# Patient Record
Sex: Female | Born: 1955
Health system: Southern US, Community
[De-identification: ages and names within clinical notes are randomized; demographics above are authoritative.]

## PROBLEM LIST (undated history)

## (undated) DIAGNOSIS — F32A Depression, unspecified: Secondary | ICD-10-CM

## (undated) DIAGNOSIS — K219 Gastro-esophageal reflux disease without esophagitis: Secondary | ICD-10-CM

## (undated) DIAGNOSIS — G5600 Carpal tunnel syndrome, unspecified upper limb: Secondary | ICD-10-CM

## (undated) DIAGNOSIS — I341 Nonrheumatic mitral (valve) prolapse: Secondary | ICD-10-CM

## (undated) DIAGNOSIS — E785 Hyperlipidemia, unspecified: Secondary | ICD-10-CM

## (undated) DIAGNOSIS — I10 Essential (primary) hypertension: Secondary | ICD-10-CM

## (undated) DIAGNOSIS — T7840XA Allergy, unspecified, initial encounter: Secondary | ICD-10-CM

## (undated) DIAGNOSIS — F329 Major depressive disorder, single episode, unspecified: Secondary | ICD-10-CM

## (undated) DIAGNOSIS — M1990 Unspecified osteoarthritis, unspecified site: Secondary | ICD-10-CM

## (undated) HISTORY — DX: Hyperlipidemia, unspecified: E78.5

## (undated) HISTORY — DX: Allergy, unspecified, initial encounter: T78.40XA

## (undated) HISTORY — PX: CHOLECYSTECTOMY: SHX55

## (undated) HISTORY — DX: Gastro-esophageal reflux disease without esophagitis: K21.9

## (undated) HISTORY — DX: Carpal tunnel syndrome, unspecified upper limb: G56.00

## (undated) HISTORY — DX: Essential (primary) hypertension: I10

## (undated) HISTORY — DX: Unspecified osteoarthritis, unspecified site: M19.90

## (undated) HISTORY — DX: Nonrheumatic mitral (valve) prolapse: I34.1

## (undated) HISTORY — PX: OTHER SURGICAL HISTORY: SHX169

## (undated) HISTORY — DX: Major depressive disorder, single episode, unspecified: F32.9

## (undated) HISTORY — DX: Depression, unspecified: F32.A

---

## 1985-10-08 HISTORY — PX: GALLBLADDER SURGERY: SHX652

## 2008-08-15 ENCOUNTER — Ambulatory Visit: Payer: Self-pay | Admitting: Cardiology

## 2008-08-15 ENCOUNTER — Observation Stay: Payer: Self-pay | Admitting: Internal Medicine

## 2008-12-27 ENCOUNTER — Emergency Department: Payer: Self-pay | Admitting: Emergency Medicine

## 2013-06-03 ENCOUNTER — Ambulatory Visit: Payer: Self-pay | Admitting: Family Medicine

## 2013-12-06 LAB — HM PAP SMEAR: HM PAP: NORMAL

## 2013-12-06 LAB — HM MAMMOGRAPHY

## 2014-04-08 ENCOUNTER — Ambulatory Visit: Payer: Self-pay | Admitting: Gastroenterology

## 2014-04-08 LAB — HM COLONOSCOPY

## 2015-04-26 ENCOUNTER — Telehealth: Payer: Self-pay | Admitting: *Deleted

## 2015-04-26 NOTE — Telephone Encounter (Signed)
Received referral for low dose CT lung cancer screening. Despite multiple attempts at all contact numbers available, have not been able to arrange for shared decision making visit and CT scan. I will be happy to assist in the future if patient so desires. Will forward to referring provider.

## 2017-03-11 ENCOUNTER — Ambulatory Visit: Payer: Self-pay | Admitting: Primary Care

## 2017-04-02 ENCOUNTER — Ambulatory Visit (INDEPENDENT_AMBULATORY_CARE_PROVIDER_SITE_OTHER): Payer: BLUE CROSS/BLUE SHIELD | Admitting: Primary Care

## 2017-04-02 ENCOUNTER — Encounter: Payer: Self-pay | Admitting: Primary Care

## 2017-04-02 VITALS — BP 126/80 | HR 60 | Temp 97.9°F | Ht 60.25 in | Wt 136.4 lb

## 2017-04-02 DIAGNOSIS — F411 Generalized anxiety disorder: Secondary | ICD-10-CM | POA: Insufficient documentation

## 2017-04-02 DIAGNOSIS — E785 Hyperlipidemia, unspecified: Secondary | ICD-10-CM | POA: Diagnosis not present

## 2017-04-02 DIAGNOSIS — G4700 Insomnia, unspecified: Secondary | ICD-10-CM | POA: Diagnosis not present

## 2017-04-02 DIAGNOSIS — R209 Unspecified disturbances of skin sensation: Secondary | ICD-10-CM | POA: Diagnosis not present

## 2017-04-02 DIAGNOSIS — I10 Essential (primary) hypertension: Secondary | ICD-10-CM

## 2017-04-02 MED ORDER — TRAZODONE HCL 50 MG PO TABS: 50.0000 mg | ORAL_TABLET | Freq: Every evening | ORAL | 0 refills | Status: DC | PRN

## 2017-04-02 MED ORDER — HYDROXYZINE HCL 25 MG PO TABS: 25.0000 mg | ORAL_TABLET | Freq: Two times a day (BID) | ORAL | 0 refills | Status: DC | PRN

## 2017-04-02 MED ORDER — SERTRALINE HCL 50 MG PO TABS: 50.0000 mg | ORAL_TABLET | Freq: Every day | ORAL | 1 refills | Status: DC

## 2017-04-02 NOTE — Assessment & Plan Note (Signed)
Stable in the office today, continue lisinopril 20 mg. Will get BMP from records.

## 2017-04-02 NOTE — Assessment & Plan Note (Signed)
Present for years, mind races. Suspect this is anxiety induced. She is doing better on Zoloft. Strongly advised against Lorazepam due to its addictive nature. Rx for low dose Trazodone sent to pharmacy. She will update is in a few weeks.

## 2017-04-02 NOTE — Progress Notes (Addendum)
Subjective:    Patient ID: Morgan Riley, female    DOB: [birth year], 61 y.o.   MRN: [id number]  HPI  Morgan Riley is a 61 year old female who presents today to establish care and discuss the problems mentioned below. Will obtain old records. Her last physical was several years ago.  1) GAD: Currently managed on sertraline 50 mg (initiated 3 weeks ago), lorazepam 0.5 mg every morning and 0.5 mg every evening. She was previously managed on another medication for which she saw no improvement, so she was switched to Zoloft. She was successful on Zoloft in the past and has done well on it over the past 3 weeks. She is going through a lot of personal stress recently, but feels that she is managing okay. She uses the lorazepam mainly for insomnia at night has her mind tends to race. She's been on the Lorazepam BID for years. She's never tried anything else for insomnia.  2) Essential Hypertension: Currently managed on lisinopril 20 mg. Her BP in the office today is 126/80. She denies chest pain, dizziness, visual changes.   3) Hyperlipidemia: Currently managed on Simvastatin 40 mg. Her last cholesterol check was about 6 months ago, but she's not entirely sure. She thinks it was normal.  4) Skin Discomfort: She uses gabapentin 300 mg twice to three times daily due to sensitive skin. Her clothing and touch will aggravate her skin causing discomfort. The gabapentin has helped to reduce arthritic pain.  5) GERD: Currently managed on omeprazole 20 mg every other day for symptoms of epigastric burning and pain. She cannot tolerate symptoms without her medication.   Review of Systems  Constitutional: Negative for fatigue.  Respiratory: Negative for shortness of breath.   Cardiovascular: Negative for chest pain.  Gastrointestinal:       Gerd   Neurological: Negative for dizziness and headaches.  Psychiatric/Behavioral: Positive for sleep disturbance. Negative for suicidal ideas.       Overall much better on  Zoloft.       Past Medical History:  Diagnosis Date  . Allergy   . Arthritis   . Depression   . GERD (gastroesophageal reflux disease)   . Hyperlipidemia   . Hypertension      Social History   Social History  . Marital status: Married    Spouse name: N/A  . Number of children: N/A  . Years of education: N/A   Occupational History  . Not on file.   Social History Main Topics  . Smoking status: Current Every Day Smoker    Packs/day: 1.00  . Smokeless tobacco: Never Used  . Alcohol use No  . Drug use: Unknown  . Sexual activity: Not on file   Other Topics Concern  . Not on file   Social History Narrative  . No narrative on file    Past Surgical History:  Procedure Laterality Date  . [place]  . tumor remove in right leg      Family History  Problem Relation Age of Onset  . Arthritis Mother   . Stroke Mother   . Hypertension Mother   . Mental illness Mother   . Diabetes Mother   . Arthritis Father   . Heart disease Father   . Hypertension Father   . Mental illness Sister   . Mental illness Sister     No Known Allergies  No current outpatient prescriptions on file prior to visit.   No current facility-administered medications on file  prior to visit.     BP 126/80   Pulse 60   Temp 97.9 F (36.6 C) (Oral)   Ht 5' 0.25" (1.53 m)   Wt 136 lb 6.4 oz (61.9 kg)   SpO2 99%   BMI 26.42 kg/m    Objective:   Physical Exam  Constitutional: She appears well-nourished.  Neck: Neck supple.  Cardiovascular: Normal rate and regular rhythm.   Pulmonary/Chest: Effort normal and breath sounds normal.  Skin: Skin is warm and dry.  Psychiatric: She has a normal mood and affect.          Assessment & Plan:

## 2017-04-02 NOTE — Assessment & Plan Note (Addendum)
Doing better on Zoloft, continue same. Refills sent to pharmacy. Strongly advised against Lorazepam due to its addictive nature. Will have her try hydroxyzine 25 mg PRN anxiety. She's mostly using Lorazepam for sleep, will trial Trazodone in place of. Removed Lorazepam from medication list as we will try to avoid prescribing if other measures work.

## 2017-04-02 NOTE — Assessment & Plan Note (Signed)
Managed on simvastatin 40 mg. Will check records for recent lipid panel. Continue current regimen.

## 2017-04-02 NOTE — Patient Instructions (Signed)
I sent refills for sertraline 50 mg tablets to your pharmacy for anxiety. Continue taking this once daily.  Try using hydroxyzine 25 mg tablets twice daily as needed for anxiety. Try not to use the Lorazepam as this is highly addictive to the body and is causing long term side effects.  Try trazodone 50 mg tablets for sleep. Take 1 tablet by mouth 30 minutes prior to bedtime as needed for sleep.  Please update [place] in a few weeks.  Please schedule a physical with me sometime this year at your convenience. You may also schedule a lab only appointment 3-4 days prior. We will discuss your lab results in detail during your physical.  It was a pleasure to meet you today! Please don't hesitate to call me with any questions. Welcome to [facility]!

## 2017-04-02 NOTE — Assessment & Plan Note (Signed)
Managed on gabapentin BID to TID with improvement. Continue same. Will obtain records for review.

## 2017-04-15 ENCOUNTER — Encounter: Payer: Self-pay | Admitting: Primary Care

## 2017-06-13 ENCOUNTER — Encounter: Payer: Self-pay | Admitting: Primary Care

## 2017-06-13 ENCOUNTER — Telehealth: Payer: Self-pay | Admitting: *Deleted

## 2017-06-13 ENCOUNTER — Ambulatory Visit (INDEPENDENT_AMBULATORY_CARE_PROVIDER_SITE_OTHER): Payer: BLUE CROSS/BLUE SHIELD | Admitting: Primary Care

## 2017-06-13 VITALS — BP 122/78 | HR 67 | Temp 98.0°F | Ht 60.25 in | Wt 133.0 lb

## 2017-06-13 DIAGNOSIS — G4700 Insomnia, unspecified: Secondary | ICD-10-CM

## 2017-06-13 DIAGNOSIS — F411 Generalized anxiety disorder: Secondary | ICD-10-CM

## 2017-06-13 DIAGNOSIS — Z122 Encounter for screening for malignant neoplasm of respiratory organs: Secondary | ICD-10-CM | POA: Diagnosis not present

## 2017-06-13 DIAGNOSIS — Z1231 Encounter for screening mammogram for malignant neoplasm of breast: Secondary | ICD-10-CM

## 2017-06-13 DIAGNOSIS — Z87891 Personal history of nicotine dependence: Secondary | ICD-10-CM

## 2017-06-13 DIAGNOSIS — Z1239 Encounter for other screening for malignant neoplasm of breast: Secondary | ICD-10-CM

## 2017-06-13 MED ORDER — SERTRALINE HCL 100 MG PO TABS
100.0000 mg | ORAL_TABLET | Freq: Every day | ORAL | 0 refills | Status: DC
Start: 2017-06-13 — End: 2017-09-09

## 2017-06-13 MED ORDER — HYDROXYZINE HCL 10 MG PO TABS: ORAL_TABLET | ORAL | 0 refills | Status: DC

## 2017-06-13 NOTE — Assessment & Plan Note (Addendum)
Improved on hydroxyzine, will discontinue trazodone. Refill sent to pharmacy for lower dose as she will also take this during the day for acute anxiety.

## 2017-06-13 NOTE — Patient Instructions (Signed)
We've increased your Zoloft from 50 mg to 100 mg. Start taking 1 and 1/2 tablets for one week then increase to the 100 mg tablet of Zoloft once daily.  I sent a new prescription for hydroxyzine to the pharmacy. Use this as needed for anxiety.  Call the [facility] to schedule your mammogram.  You will be contacted regarding your referral for Lung Cancer Screening.  Please let us know if you have not heard back within one week.   Please schedule a physical with me in the near future. You may also schedule a lab only appointment 3-4 days prior. We will discuss your lab results in detail during your physical.  It was a pleasure to see you today!

## 2017-06-13 NOTE — Telephone Encounter (Signed)
Received referral for initial lung cancer screening scan. Contacted patient and obtained smoking history,(current, 35 pack year) as well as answering questions related to screening process. Patient denies signs of lung cancer such as weight loss or hemoptysis. Patient denies comorbidity that would prevent curative treatment if lung cancer were found. Patient is scheduled for shared decision making visit and CT scan on 06/19/17.

## 2017-06-13 NOTE — Assessment & Plan Note (Signed)
Will increase Zoloft to 75 mg 1 week, then to 100 mg thereafter. Reduced dose of hydroxyzine to prevent drowsiness. We'll call her for an update in 4 weeks for follow-up of the increased dose of Zoloft.

## 2017-06-13 NOTE — Progress Notes (Signed)
Subjective:    Patient ID: Morgan Riley, female    DOB: [birth year], 61 y.o.   MRN: [id number]  HPI  Morgan Riley is a 61 year old female who presents today to discuss anxiety. She is also due for a mammogram and is wanting to get that done. She also thinks she is due for repeat colonoscopy.  She is currently managed on Zoloft 50 mg and Trazodone 50 mg. Previously managed on lorazepam which was discouraged/discontinued in late June 2018. She had been taking lorazepam BID for years. Last visit she was placed on hydroxyzine which helps with sleep but causes drowsiness if she takes this during the day. She thinks she needs something in the morning when waking to help with morning anxiety. When waking she feels very anxious.  She's overall doing better on Zoloft as she's not tearful. She continues to worry and feel nervous. She has a hard time dealing with stress. She does watch her 60 year old granddaughter daily. She's not had to use the trazodone as the hydroxyzine has helped with sleep at bedtime. She denies SI/HI.  2) Tobacco Abuse: History of tobacco abuse for the past 30 years. Currently smoking 1/2-3/4 packs per day. She is working to wean herself down. She is interested in lung cancer screening and denies hemoptysis, unexplained weight loss, chronic cough. She does experience intermittent shortness of breath with exertion.  Review of Systems  Constitutional: Negative for fatigue and unexpected weight change.  Respiratory:       Intermittent shortness of breath.  Cardiovascular: Negative for chest pain.  Psychiatric/Behavioral: The patient is nervous/anxious.        Past Medical History:  Diagnosis Date  . Allergy   . Arthritis   . Carpal tunnel syndrome   . Depression   . GERD (gastroesophageal reflux disease)   . Hyperlipidemia   . Hypertension   . Mitral valve prolapse      Social History   Social History  . Marital status: Married    Spouse name: N/A  . Number of children:  N/A  . Years of education: N/A   Occupational History  . Not on file.   Social History Main Topics  . Smoking status: Current Every Day Smoker    Packs/day: 1.00  . Smokeless tobacco: Never Used  . Alcohol use No  . Drug use: Unknown  . Sexual activity: Not on file   Other Topics Concern  . Not on file   Social History Narrative  . No narrative on file    Past Surgical History:  Procedure Laterality Date  . [place]  . tumor remove in right leg      Family History  Problem Relation Age of Onset  . Arthritis Mother   . Stroke Mother   . Hypertension Mother   . Mental illness Mother   . Diabetes Mother   . Arthritis Father   . Heart disease Father   . Hypertension Father   . Mental illness Sister   . Mental illness Sister     No Known Allergies  Current Outpatient Prescriptions on File Prior to Visit  Medication Sig Dispense Refill  . amoxicillin (AMOXIL) 875 MG tablet TAKE 1 TABLET BY MOUTH THREE TIMES A DAY TODAY THEN TWICE A DAY UNTIL GONE  0  . gabapentin (NEURONTIN) 300 MG capsule Take by mouth.    Marland Kitchen lisinopril (PRINIVIL,ZESTRIL) 20 MG tablet Take 20 mg by mouth daily.  3  . omeprazole (PRILOSEC)  20 MG capsule Take 20 mg by mouth daily.  5  . simvastatin (ZOCOR) 40 MG tablet TAKE 1 TABLET (40 MG TOTAL) BY MOUTH NIGHTLY.    . traZODone (DESYREL) 50 MG tablet Take 1 tablet (50 mg total) by mouth at bedtime as needed for sleep. 30 tablet 0   No current facility-administered medications on file prior to visit.     BP 122/78   Pulse 67   Temp 98 F (36.7 C) (Oral)   Ht 5' 0.25" (1.53 m)   Wt 133 lb (60.3 kg)   SpO2 98%   BMI 25.76 kg/m    Objective:   Physical Exam  Constitutional: She appears well-nourished.  Neck: Neck supple.  Cardiovascular: Normal rate and regular rhythm.   Pulmonary/Chest: Effort normal and breath sounds normal. She has no rales.  Skin: Skin is warm and dry.  Psychiatric: She has a normal mood and affect.            Assessment & Plan:

## 2017-06-14 ENCOUNTER — Telehealth: Payer: Self-pay | Admitting: Primary Care

## 2017-06-14 ENCOUNTER — Other Ambulatory Visit: Payer: Self-pay | Admitting: Primary Care

## 2017-06-14 DIAGNOSIS — Z1211 Encounter for screening for malignant neoplasm of colon: Secondary | ICD-10-CM

## 2017-06-14 NOTE — Telephone Encounter (Signed)
Please notify patient that I received her colonoscopy report and that she's due now for repeat colonoscopy. i've placed a referral to GI so someone should be in touch with her soon.

## 2017-06-18 NOTE — Telephone Encounter (Signed)
Spoken and notified patient of Kate's comments. Patient verbalized understanding. 

## 2017-06-19 ENCOUNTER — Ambulatory Visit
Admission: RE | Admit: 2017-06-19 | Discharge: 2017-06-19 | Disposition: A | Discharge status: Home / Self Care | Payer: BLUE CROSS/BLUE SHIELD | Source: Ambulatory Visit | Attending: Oncology | Admitting: Oncology

## 2017-06-19 ENCOUNTER — Inpatient Hospital Stay: Payer: BLUE CROSS/BLUE SHIELD | Attending: Nurse Practitioner | Admitting: Nurse Practitioner

## 2017-06-19 ENCOUNTER — Encounter: Payer: Self-pay | Admitting: Nurse Practitioner

## 2017-06-19 DIAGNOSIS — J439 Emphysema, unspecified: Secondary | ICD-10-CM | POA: Diagnosis not present

## 2017-06-19 DIAGNOSIS — R918 Other nonspecific abnormal finding of lung field: Secondary | ICD-10-CM | POA: Diagnosis not present

## 2017-06-19 DIAGNOSIS — I2510 Atherosclerotic heart disease of native coronary artery without angina pectoris: Secondary | ICD-10-CM | POA: Diagnosis not present

## 2017-06-19 DIAGNOSIS — Z87891 Personal history of nicotine dependence: Secondary | ICD-10-CM | POA: Insufficient documentation

## 2017-06-19 DIAGNOSIS — I700 Atherosclerosis of aorta: Secondary | ICD-10-CM | POA: Diagnosis not present

## 2017-06-19 DIAGNOSIS — F17210 Nicotine dependence, cigarettes, uncomplicated: Secondary | ICD-10-CM | POA: Diagnosis not present

## 2017-06-19 DIAGNOSIS — Z122 Encounter for screening for malignant neoplasm of respiratory organs: Secondary | ICD-10-CM

## 2017-06-20 DIAGNOSIS — Z720 Tobacco use: Secondary | ICD-10-CM | POA: Insufficient documentation

## 2017-06-20 NOTE — Progress Notes (Signed)
In accordance with CMS guidelines, patient has met eligibility criteria including age, absence of signs or symptoms of lung cancer.  Social History  Substance Use Topics  . Smoking status: Current Every Day Smoker    Packs/day: 1.00    Years: 35.00  . Smokeless tobacco: Never Used  . Alcohol use No     A shared decision-making session was conducted prior to the performance of CT scan. This includes one or more decision aids, includes benefits and harms of screening, follow-up diagnostic testing, over-diagnosis, false positive rate, and total radiation exposure.  Counseling on the importance of adherence to annual lung cancer LDCT screening, impact of co-morbidities, and ability or willingness to undergo diagnosis and treatment is imperative for compliance of the program.  Counseling on the importance of continued smoking cessation for former smokers; the importance of smoking cessation for current smokers, and information about tobacco cessation interventions have been given to patient including [place] and [facility] quit [place] programs.  Written order for lung cancer screening with LDCT has been given to the patient and any and all questions have been answered to the best of my abilities.   Yearly follow up will be coordinated by Burgess Estelle, Thoracic Navigator.  Beckey Rutter, NP 06/20/17 8:51 AM

## 2017-06-21 ENCOUNTER — Encounter: Payer: Self-pay | Admitting: *Deleted

## 2017-07-01 ENCOUNTER — Telehealth: Payer: Self-pay

## 2017-07-01 NOTE — Telephone Encounter (Signed)
Please have patient seen in the office this week for evaluation, I need to evaluate her. Thanks.

## 2017-07-01 NOTE — Telephone Encounter (Signed)
Spoken and notified patient of Kate's comments. Patient verbalized understanding.  Office visit on 07/02/2017

## 2017-07-01 NOTE — Telephone Encounter (Signed)
Pt left v/m; pt had a CT chest for lung CA screening;pt got notice that the scan showed inflammation in lungs; pt has had fever and pt request Allie Bossier NP to review findings of CT chest and cb if needs any type treatment. [facility].

## 2017-07-02 ENCOUNTER — Ambulatory Visit (INDEPENDENT_AMBULATORY_CARE_PROVIDER_SITE_OTHER): Payer: BLUE CROSS/BLUE SHIELD | Admitting: Primary Care

## 2017-07-02 VITALS — BP 124/76 | HR 64 | Temp 97.8°F | Ht 60.25 in | Wt 133.1 lb

## 2017-07-02 DIAGNOSIS — R350 Frequency of micturition: Secondary | ICD-10-CM

## 2017-07-02 DIAGNOSIS — J189 Pneumonia, unspecified organism: Secondary | ICD-10-CM | POA: Diagnosis not present

## 2017-07-02 LAB — POC URINALSYSI DIPSTICK (AUTOMATED)
Bilirubin, UA: NEGATIVE
Blood, UA: NEGATIVE
Glucose, UA: NEGATIVE
Ketones, UA: NEGATIVE
Leukocytes, UA: NEGATIVE
Nitrite, UA: NEGATIVE
PH UA: 8 (ref 5.0–8.0)
PROTEIN UA: NEGATIVE
Spec Grav, UA: 1.015 (ref 1.010–1.025)
Urobilinogen, UA: 0.2 E.U./dL

## 2017-07-02 MED ORDER — AMOXICILLIN-POT CLAVULANATE 875-125 MG PO TABS: 1.0000 | ORAL_TABLET | Freq: Two times a day (BID) | ORAL | 0 refills | Status: DC

## 2017-07-02 NOTE — Progress Notes (Signed)
Subjective:    Patient ID: Morgan Riley, female    DOB: November 01, 1955, 61 y.o.   MRN: [id number]  HPI  Morgan Riley is a 61 year old female who presents today for follow up from recent CT results. She underwent low dose CT scan for lung cancer screening on 06/19/17. Her CT was negative for malignancy but did show suspicion for nonspecific interstitial pneumonitis.   Over the past two weeks she's experiencing low grade fevers, chills, morning cough, fatigue, urinary frequency. She's taken one dose of Amoxicillin 875 mg from a prescription that was provided by her dentist for a tooth infection. She denies hematuria, abdominal pain, nausea, vaginal symptoms.  Review of Systems  HENT: Positive for congestion. Negative for sore throat.   Respiratory: Positive for cough and shortness of breath. Negative for wheezing.   Cardiovascular: Negative for chest pain.  Genitourinary: Positive for frequency. Negative for dysuria, flank pain, hematuria and vaginal discharge.  Musculoskeletal: Positive for arthralgias.       Past Medical History:  Diagnosis Date  . Allergy   . Arthritis   . Carpal tunnel syndrome   . Depression   . GERD (gastroesophageal reflux disease)   . Hyperlipidemia   . Hypertension   . Mitral valve prolapse      Social History   Social History  . Marital status: Married    Spouse name: N/A  . Number of children: N/A  . Years of education: N/A   Occupational History  . Not on file.   Social History Main Topics  . Smoking status: Current Every Day Smoker    Packs/day: 1.00    Years: 35.00  . Smokeless tobacco: Never Used  . Alcohol use No  . Drug use: Unknown  . Sexual activity: Not on file   Other Topics Concern  . Not on file   Social History Narrative  . No narrative on file    Past Surgical History:  Procedure Laterality Date  . [place]  . tumor remove in right leg      Family History  Problem Relation Age of Onset  . Arthritis  Mother   . Stroke Mother   . Hypertension Mother   . Mental illness Mother   . Diabetes Mother   . Arthritis Father   . Heart disease Father   . Hypertension Father   . Mental illness Sister   . Mental illness Sister     No Known Allergies  Current Outpatient Prescriptions on File Prior to Visit  Medication Sig Dispense Refill  . amoxicillin (AMOXIL) 875 MG tablet TAKE 1 TABLET BY MOUTH THREE TIMES A DAY TODAY THEN TWICE A DAY UNTIL GONE  0  . gabapentin (NEURONTIN) 300 MG capsule Take by mouth.    . hydrOXYzine (ATARAX/VISTARIL) 10 MG tablet Take 1 tablet in the morning as needed for anxiety and take 1-2 tablets at bedtime as needed for anxiety. 90 tablet 0  . lisinopril (PRINIVIL,ZESTRIL) 20 MG tablet Take 20 mg by mouth daily.  3  . omeprazole (PRILOSEC) 20 MG capsule Take 20 mg by mouth daily.  5  . sertraline (ZOLOFT) 100 MG tablet Take 1 tablet (100 mg total) by mouth daily. 90 tablet 0  . simvastatin (ZOCOR) 40 MG tablet TAKE 1 TABLET (40 MG TOTAL) BY MOUTH NIGHTLY.     No current facility-administered medications on file prior to visit.     BP 124/76   Pulse 64   Temp 97.8 F (36.6  C) (Oral)   Ht 5' 0.25" (1.53 m)   Wt 133 lb 1.9 oz (60.4 kg)   SpO2 97%   BMI 25.78 kg/m    Objective:   Physical Exam  Constitutional: She appears well-nourished. She does not have a sickly appearance.  HENT:  Right Ear: Tympanic membrane and ear canal normal.  Left Ear: Tympanic membrane and ear canal normal.  Nose: Right sinus exhibits no maxillary sinus tenderness and no frontal sinus tenderness. Left sinus exhibits no maxillary sinus tenderness and no frontal sinus tenderness.  Mouth/Throat: Oropharynx is clear and moist.  Eyes: Conjunctivae are normal.  Neck: Neck supple.  Cardiovascular: Normal rate and regular rhythm.   Pulmonary/Chest: Effort normal and breath sounds normal. She has no rales.  Difficult to auscultate lung sounds to upper lobes, seem diminished.     Lymphadenopathy:    She has no cervical adenopathy.  Skin: Skin is warm and dry.          Assessment & Plan:  Pneumonitis:  Noted on CT scan for lung cancer screening. Has had symptoms for the past 2 weeks, no improvement. Exam today with diminished sounds to bilateral upper lobes. Rx for Augmentin course sent to pharmacy. Stop Amoxil from old prescription. Fluids, rest, follow up PRN.  Sheral Flow, NP

## 2017-07-02 NOTE — Patient Instructions (Signed)
Start Augmentin antibiotics. Take 1 tablet by mouth twice daily for 7 days.  You do not have a urinary tract infection.  Ensure you stay hydrated with water and rest.  It was a pleasure to see you today!

## 2017-07-03 ENCOUNTER — Telehealth: Payer: Self-pay

## 2017-07-03 DIAGNOSIS — T3695XA Adverse effect of unspecified systemic antibiotic, initial encounter: Principal | ICD-10-CM

## 2017-07-03 DIAGNOSIS — B379 Candidiasis, unspecified: Secondary | ICD-10-CM

## 2017-07-03 MED ORDER — FLUCONAZOLE 150 MG PO TABS: 150.0000 mg | ORAL_TABLET | Freq: Once | ORAL | 0 refills | Status: AC

## 2017-07-03 NOTE — Telephone Encounter (Signed)
Noted, please notify patient that I sent in [place] to her pharmacy. Take 1 tablet by mouth once.

## 2017-07-03 NOTE — Telephone Encounter (Signed)
Pt left v/m; pt seen 07/02/17 with pneumonia and was given abx and pt request med for yeast infection (gets yeast infection after taking abx) sent to [facility].

## 2017-07-03 NOTE — Telephone Encounter (Signed)
Tried to call patient and could not leave message since voicemail is not set up.

## 2017-07-04 NOTE — Progress Notes (Signed)
Cardiology Office Note  Date:  07/05/2017   ID:  Morgan Riley, DOB Mar 16, 1956, MRN [id number]  PCP:  Morgan Koch, NP   Chief Complaint  Patient presents with  . other    Aortic atherosclerosis. Meds reviewed verbally with pt.    HPI:  Morgan Riley is a 61 year old woman with history of Smoker, active Hyperlipidemia Hypertension CT scan showing emphysema, aortic atherosis, coronary disease Who presents by referral from Morgan Riley for consultation of her CT findings of aortic atherosclerosis and coronary disease  On evaluation today, she reports that she is getting over a pneumonia She reports having recent shortness of breath and chest tightness, Symptoms were new, she felt  that something was not right  CT scan chest was performed  06/19/2017 Cardiovascular: Tortuous thoracic aorta. Aortic atherosclerosis. Normal heart size with mild lipomatous hypertrophy of the interatrial septum. Multivessel coronary artery atherosclerosis. Emphysema  Images pulled up in the office today, minimal coronary calcification noted in the LAD, proximal RCA. Very mild aortic atherosclerosis seen No significant plaque noted in the proximal carotid  Denies having any symptoms of chest pain or shortness of breath prior to recent upper respiratory infection, typically is very active No regular exercise program  EKG personally reviewed by myself on todays visit Shows normal sinus rhythm with rate 73 bpm no significant ST or T-wave changes, rare PVC    PMH:   has a past medical history of Allergy; Arthritis; Carpal tunnel syndrome; Depression; GERD (gastroesophageal reflux disease); Hyperlipidemia; Hypertension; and Mitral valve prolapse.  PSH:    Past Surgical History:  Procedure Laterality Date  . [place]  . tumor remove in right leg      Current Outpatient Prescriptions  Medication Sig Dispense Refill  . acetaminophen (TYLENOL) 325 MG tablet Take 650 mg by mouth  every 6 (six) hours as needed.    Marland Kitchen amoxicillin (AMOXIL) 875 MG tablet TAKE 1 TABLET BY MOUTH THREE TIMES A DAY TODAY THEN TWICE A DAY UNTIL GONE  0  . gabapentin (NEURONTIN) 300 MG capsule Take by mouth.    . hydrOXYzine (ATARAX/VISTARIL) 10 MG tablet Take 1 tablet in the morning as needed for anxiety and take 1-2 tablets at bedtime as needed for anxiety. 90 tablet 0  . lisinopril (PRINIVIL,ZESTRIL) 20 MG tablet Take 20 mg by mouth daily.  3  . omeprazole (PRILOSEC) 20 MG capsule Take 20 mg by mouth daily.  5  . sertraline (ZOLOFT) 100 MG tablet Take 1 tablet (100 mg total) by mouth daily. 90 tablet 0  . simvastatin (ZOCOR) 40 MG tablet TAKE 1 TABLET (40 MG TOTAL) BY MOUTH NIGHTLY.    Marland Kitchen amoxicillin-clavulanate (AUGMENTIN) 875-125 MG tablet Take 1 tablet by mouth 2 (two) times daily. (Patient not taking: Reported on 07/05/2017) 14 tablet 0  . varenicline (CHANTIX CONTINUING MONTH PAK) 1 MG tablet Take 1 tablet (1 mg total) by mouth 2 (two) times daily. 60 tablet 4   No current facility-administered medications for this visit.      Allergies:   Patient has no known allergies.   Social History:  The patient  reports that she has been smoking.  She has a 35.00 pack-year smoking history. She has never used smokeless tobacco. She reports that she does not drink alcohol or use drugs.   Family History:   family history includes Arthritis in her father and mother; Diabetes in her mother; Heart disease in her father; Hypertension in her father and mother; Mental illness in  her mother, sister, and sister; Stroke in her mother.    Review of Systems: Review of Systems  Constitutional: Negative.   Respiratory: Negative.   Cardiovascular: Negative.   Gastrointestinal: Negative.   Musculoskeletal: Negative.   Neurological: Negative.   Psychiatric/Behavioral: Negative.   All other systems reviewed and are negative.    PHYSICAL EXAM: VS:  BP 116/74 (BP Location: Right Arm, Patient Position:  Sitting, Cuff Size: Normal)   Pulse 73   Ht 5' (1.524 m)   Wt 133 lb 8 oz (60.6 kg)   BMI 26.07 kg/m  , BMI Body mass index is 26.07 kg/m. GEN: Well nourished, well developed, in no acute distress  HEENT: normal  Neck: no JVD, carotid bruits, or masses Cardiac: RRR; no murmurs, rubs, or gallops,no edema  Respiratory:  Mildly decreased breath sounds throughout, normal work of breathing GI: soft, nontender, nondistended, + BS MS: no deformity or atrophy  Skin: warm and dry, no rash Neuro:  Strength and sensation are intact Psych: euthymic mood, full affect    Recent Labs: No results found for requested labs within last 8760 hours.    Lipid Panel No results found for: CHOL, HDL, LDLCALC, TRIG    Wt Readings from Last 3 Encounters:  07/05/17 133 lb 8 oz (60.6 kg)  07/02/17 133 lb 1.9 oz (60.4 kg)  06/19/17 133 lb (60.3 kg)       ASSESSMENT AND PLAN:  Coronary artery disease of native artery of native heart with stable angina pectoris ([place]) - Plan: EKG 12-Lead Mild coronary calcifications seen on CT scan chest Denies having any anginal symptoms, Recommended she could consider aspirin 81 mg daily, Continue statin, strongly recommended smoking cessation No further testing needed at this time  Smoker - Plan: EKG 12-Lead Long discussion with her, prescription provided for Chantix with coupon More than 5 minutes spent discussing  smoking cessation techniques  Aortic atherosclerosis ([place]) - Plan: EKG 12-Lead Very mild aortic atherosclerosis Images reviewed with her in detail in the office  Essential hypertension Blood pressure is well controlled on today's visit. No changes made to the medications.  Mixed hyperlipidemia Goal LDL less than 70 Recommended she stay on her simvastatin  Disposition:   F/U  as needed  Patient was seen in consultation for Morgan Riley and will be referred back to her office for ongoing care of the issues detailed above   Total  encounter time more than 60 minutes  Greater than 50% was spent in counseling and coordination of care with the patient    Orders Placed This Encounter  Procedures  . EKG 12-Lead     Signed, Morgan Riley, M.D., Ph.D. 07/05/2017  [place], [place]

## 2017-07-05 ENCOUNTER — Ambulatory Visit (INDEPENDENT_AMBULATORY_CARE_PROVIDER_SITE_OTHER): Payer: BLUE CROSS/BLUE SHIELD | Admitting: Cardiovascular Disease

## 2017-07-05 ENCOUNTER — Encounter: Payer: Self-pay | Admitting: Cardiovascular Disease

## 2017-07-05 VITALS — BP 116/74 | HR 73 | Ht 60.0 in | Wt 133.5 lb

## 2017-07-05 DIAGNOSIS — F17200 Nicotine dependence, unspecified, uncomplicated: Secondary | ICD-10-CM

## 2017-07-05 DIAGNOSIS — E782 Mixed hyperlipidemia: Secondary | ICD-10-CM | POA: Diagnosis not present

## 2017-07-05 DIAGNOSIS — I25118 Atherosclerotic heart disease of native coronary artery with other forms of angina pectoris: Secondary | ICD-10-CM

## 2017-07-05 DIAGNOSIS — I10 Essential (primary) hypertension: Secondary | ICD-10-CM | POA: Diagnosis not present

## 2017-07-05 DIAGNOSIS — I700 Atherosclerosis of aorta: Secondary | ICD-10-CM

## 2017-07-05 MED ORDER — VARENICLINE TARTRATE 1 MG PO TABS: 1.0000 mg | ORAL_TABLET | Freq: Two times a day (BID) | ORAL | 4 refills | Status: DC

## 2017-07-05 NOTE — Telephone Encounter (Signed)
Patient advised.

## 2017-07-05 NOTE — Patient Instructions (Addendum)
Medication Instructions:   Try chantix 1/2 pill twice a day for a few weeks Then increase up to a full pill twice a day Go for at least 3 months  Labwork:  Goal total cholesterol is <150 Goal LDL <70  Testing/Procedures:  No further testing at this time   Follow-Up: It was a pleasure seeing you in the office today. Please call us if you have new issues that need to be addressed before your next appt.  [phone]  Your physician wants you to follow-up in: as needed  If you need a refill on your cardiac medications before your next appointment, please call your pharmacy.

## 2017-07-10 ENCOUNTER — Telehealth: Payer: Self-pay | Admitting: Primary Care

## 2017-07-10 NOTE — Telephone Encounter (Addendum)
Spoken to patient and will call office the schedule the CPE.

## 2017-07-10 NOTE — Telephone Encounter (Signed)
Caller Name:Morgan Riley  Relationship to Patient:self  Best number:[phone] Pharmacy:  Reason for call: would like labs, says it has been a long time since she has had labs done.  Does she need cpe?

## 2017-07-11 ENCOUNTER — Other Ambulatory Visit: Payer: Self-pay | Admitting: Primary Care

## 2017-07-11 ENCOUNTER — Telehealth: Payer: Self-pay | Admitting: Primary Care

## 2017-07-11 DIAGNOSIS — F411 Generalized anxiety disorder: Secondary | ICD-10-CM

## 2017-07-11 NOTE — Telephone Encounter (Signed)
-----   Message from Pleas Koch, NP sent at 06/13/2017 12:38 PM EDT ----- Regarding: Anxiety Please check on patient since we increased her Zoloft to 100 mg. She still experiencing drowsiness with the lower dose of hydroxyzine?

## 2017-07-11 NOTE — Telephone Encounter (Signed)
Ok to refill? Electronically refill request for hydrOXYzine (ATARAX/VISTARIL) 10 MG tablet  Last prescribed on 06/13/2017. Last seen on 07/02/2017

## 2017-07-12 NOTE — Telephone Encounter (Signed)
Message left for patient to return my call.  

## 2017-07-15 NOTE — Telephone Encounter (Signed)
How's she doing on the reduced dose of hydroxyzine?

## 2017-07-16 MED ORDER — HYDROXYZINE HCL 10 MG PO TABS: ORAL_TABLET | ORAL | 2 refills | Status: DC

## 2017-07-16 NOTE — Telephone Encounter (Signed)
Noted, refills sent to pharmacy. 

## 2017-07-16 NOTE — Telephone Encounter (Signed)
Patient stated that she is doing good. The drowsiness is not bad with the lower dosage.  Patient ask for a refill to be sent to the pharmacy

## 2017-07-16 NOTE — Telephone Encounter (Signed)
Patient stated that she is doing good. The drowsiness is not bad with the lower dosage.

## 2017-07-19 ENCOUNTER — Other Ambulatory Visit: Payer: Self-pay

## 2017-07-19 ENCOUNTER — Telehealth: Payer: Self-pay

## 2017-07-19 DIAGNOSIS — Z86010 Personal history of colonic polyps: Secondary | ICD-10-CM

## 2017-07-19 NOTE — Telephone Encounter (Signed)
Gastroenterology Pre-Procedure Review  Request Date: 10/30 Requesting Physician: Dr. Vicente Males  PATIENT REVIEW QUESTIONS: The patient responded to the following health history questions as indicated:    1. Are you having any GI issues? no 2. Do you have a personal history of Polyps? yes (2015 @ Dr Allen Norris) 3. Do you have a family history of Colon Cancer or Polyps? no 4. Diabetes Mellitus? no 5. Joint replacements in the past 12 months?no 6. Major health problems in the past 3 months?no 7. Any artificial heart valves, MVP, or defibrillator?no    MEDICATIONS & ALLERGIES:    Patient reports the following regarding taking any anticoagulation/antiplatelet therapy:   Plavix, Coumadin, Eliquis, Xarelto, Lovenox, Pradaxa, Brilinta, or Effient? no Aspirin? no  Patient confirms/reports the following medications:  Current Outpatient Prescriptions  Medication Sig Dispense Refill  . acetaminophen (TYLENOL) 325 MG tablet Take 650 mg by mouth every 6 (six) hours as needed.    Marland Kitchen amoxicillin (AMOXIL) 875 MG tablet TAKE 1 TABLET BY MOUTH THREE TIMES A DAY TODAY THEN TWICE A DAY UNTIL GONE  0  . amoxicillin-clavulanate (AUGMENTIN) 875-125 MG tablet Take 1 tablet by mouth 2 (two) times daily. (Patient not taking: Reported on 07/05/2017) 14 tablet 0  . gabapentin (NEURONTIN) 300 MG capsule Take by mouth.    . hydrOXYzine (ATARAX/VISTARIL) 10 MG tablet Take 1 tablet in the morning as needed for anxiety and take 1-2 tablets at bedtime as needed for anxiety. 90 tablet 2  . lisinopril (PRINIVIL,ZESTRIL) 20 MG tablet Take 20 mg by mouth daily.  3  . omeprazole (PRILOSEC) 20 MG capsule Take 20 mg by mouth daily.  5  . sertraline (ZOLOFT) 100 MG tablet Take 1 tablet (100 mg total) by mouth daily. 90 tablet 0  . simvastatin (ZOCOR) 40 MG tablet TAKE 1 TABLET (40 MG TOTAL) BY MOUTH NIGHTLY.    . varenicline (CHANTIX CONTINUING MONTH PAK) 1 MG tablet Take 1 tablet (1 mg total) by mouth 2 (two) times daily. 60 tablet 4    No current facility-administered medications for this visit.     Patient confirms/reports the following allergies:  No Known Allergies  No orders of the defined types were placed in this encounter.   AUTHORIZATION INFORMATION Primary Insurance: 1D#: Group #:  Secondary Insurance: 1D#: Group #:  SCHEDULE INFORMATION: Date: 10/30 Time: Location: [place]

## 2017-08-06 ENCOUNTER — Ambulatory Visit
Admission: RE | Admit: 2017-08-06 | Discharge: 2017-08-06 | Disposition: A | Discharge status: Home / Self Care | Payer: BLUE CROSS/BLUE SHIELD | Source: Ambulatory Visit | Attending: Gastroenterology | Admitting: Gastroenterology

## 2017-08-06 ENCOUNTER — Ambulatory Visit: Payer: BLUE CROSS/BLUE SHIELD | Admitting: Certified Registered Nurse Anesthetist

## 2017-08-06 ENCOUNTER — Encounter
Admission: RE | Disposition: A | Discharge status: Home / Self Care | Payer: Self-pay | Source: Ambulatory Visit | Attending: Gastroenterology

## 2017-08-06 DIAGNOSIS — Z823 Family history of stroke: Secondary | ICD-10-CM | POA: Insufficient documentation

## 2017-08-06 DIAGNOSIS — Z8261 Family history of arthritis: Secondary | ICD-10-CM | POA: Insufficient documentation

## 2017-08-06 DIAGNOSIS — Z9049 Acquired absence of other specified parts of digestive tract: Secondary | ICD-10-CM | POA: Insufficient documentation

## 2017-08-06 DIAGNOSIS — K635 Polyp of colon: Secondary | ICD-10-CM | POA: Insufficient documentation

## 2017-08-06 DIAGNOSIS — D122 Benign neoplasm of ascending colon: Secondary | ICD-10-CM | POA: Diagnosis not present

## 2017-08-06 DIAGNOSIS — Z833 Family history of diabetes mellitus: Secondary | ICD-10-CM | POA: Diagnosis not present

## 2017-08-06 DIAGNOSIS — K219 Gastro-esophageal reflux disease without esophagitis: Secondary | ICD-10-CM | POA: Diagnosis not present

## 2017-08-06 DIAGNOSIS — D123 Benign neoplasm of transverse colon: Secondary | ICD-10-CM | POA: Insufficient documentation

## 2017-08-06 DIAGNOSIS — K641 Second degree hemorrhoids: Secondary | ICD-10-CM | POA: Insufficient documentation

## 2017-08-06 DIAGNOSIS — I341 Nonrheumatic mitral (valve) prolapse: Secondary | ICD-10-CM | POA: Insufficient documentation

## 2017-08-06 DIAGNOSIS — I10 Essential (primary) hypertension: Secondary | ICD-10-CM | POA: Diagnosis not present

## 2017-08-06 DIAGNOSIS — Z860100 Personal history of colon polyps, unspecified: Secondary | ICD-10-CM

## 2017-08-06 DIAGNOSIS — Z79899 Other long term (current) drug therapy: Secondary | ICD-10-CM | POA: Insufficient documentation

## 2017-08-06 DIAGNOSIS — Z1211 Encounter for screening for malignant neoplasm of colon: Secondary | ICD-10-CM | POA: Insufficient documentation

## 2017-08-06 DIAGNOSIS — D120 Benign neoplasm of cecum: Secondary | ICD-10-CM

## 2017-08-06 DIAGNOSIS — Z818 Family history of other mental and behavioral disorders: Secondary | ICD-10-CM | POA: Insufficient documentation

## 2017-08-06 DIAGNOSIS — Z9889 Other specified postprocedural states: Secondary | ICD-10-CM | POA: Diagnosis not present

## 2017-08-06 DIAGNOSIS — G5600 Carpal tunnel syndrome, unspecified upper limb: Secondary | ICD-10-CM | POA: Diagnosis not present

## 2017-08-06 DIAGNOSIS — E785 Hyperlipidemia, unspecified: Secondary | ICD-10-CM | POA: Insufficient documentation

## 2017-08-06 DIAGNOSIS — F17210 Nicotine dependence, cigarettes, uncomplicated: Secondary | ICD-10-CM | POA: Insufficient documentation

## 2017-08-06 DIAGNOSIS — D124 Benign neoplasm of descending colon: Secondary | ICD-10-CM | POA: Insufficient documentation

## 2017-08-06 DIAGNOSIS — Z8249 Family history of ischemic heart disease and other diseases of the circulatory system: Secondary | ICD-10-CM | POA: Diagnosis not present

## 2017-08-06 DIAGNOSIS — K5730 Diverticulosis of large intestine without perforation or abscess without bleeding: Secondary | ICD-10-CM | POA: Insufficient documentation

## 2017-08-06 DIAGNOSIS — F418 Other specified anxiety disorders: Secondary | ICD-10-CM | POA: Diagnosis not present

## 2017-08-06 HISTORY — PX: COLONOSCOPY WITH PROPOFOL: SHX5780

## 2017-08-06 SURGERY — COLONOSCOPY WITH PROPOFOL
Anesthesia: General

## 2017-08-06 MED ORDER — EPHEDRINE SULFATE 50 MG/ML IJ SOLN
INTRAMUSCULAR | Status: AC
  Filled 2017-08-06: qty 1

## 2017-08-06 MED ORDER — LIDOCAINE HCL (CARDIAC) 20 MG/ML IV SOLN
INTRAVENOUS | Status: DC | PRN
  Administered 2017-08-06: 100 mg via INTRAVENOUS

## 2017-08-06 MED ORDER — PROPOFOL 500 MG/50ML IV EMUL
INTRAVENOUS | Status: DC | PRN
  Administered 2017-08-06: 150 ug/kg/min via INTRAVENOUS

## 2017-08-06 MED ORDER — PROPOFOL 10 MG/ML IV BOLUS
INTRAVENOUS | Status: DC | PRN
  Administered 2017-08-06: 50 mg via INTRAVENOUS

## 2017-08-06 MED ORDER — LIDOCAINE HCL (PF) 2 % IJ SOLN
INTRAMUSCULAR | Status: AC
  Filled 2017-08-06: qty 10

## 2017-08-06 MED ORDER — PROPOFOL 500 MG/50ML IV EMUL
INTRAVENOUS | Status: AC
  Filled 2017-08-06: qty 50

## 2017-08-06 MED ORDER — GLYCOPYRROLATE 0.2 MG/ML IJ SOLN
INTRAMUSCULAR | Status: AC
  Filled 2017-08-06: qty 1

## 2017-08-06 MED ORDER — PHENYLEPHRINE HCL 10 MG/ML IJ SOLN
INTRAMUSCULAR | Status: AC
  Filled 2017-08-06: qty 1

## 2017-08-06 MED ORDER — SUCCINYLCHOLINE CHLORIDE 20 MG/ML IJ SOLN
INTRAMUSCULAR | Status: AC
  Filled 2017-08-06: qty 1

## 2017-08-06 MED ORDER — PROPOFOL 10 MG/ML IV BOLUS
INTRAVENOUS | Status: AC
  Filled 2017-08-06: qty 20

## 2017-08-06 MED ORDER — SODIUM CHLORIDE 0.9 % IV SOLN
INTRAVENOUS | Status: DC
  Administered 2017-08-06: 1000 mL via INTRAVENOUS

## 2017-08-06 MED ORDER — LIDOCAINE HCL (PF) 1 % IJ SOLN
INTRAMUSCULAR | Status: AC
  Administered 2017-08-06: 0.3 mL via INTRADERMAL
  Filled 2017-08-06: qty 2

## 2017-08-06 MED ORDER — LIDOCAINE HCL (PF) 1 % IJ SOLN
2.0000 mL | Freq: Once | INTRAMUSCULAR | Status: AC
  Administered 2017-08-06: 0.3 mL via INTRADERMAL

## 2017-08-06 NOTE — Anesthesia Postprocedure Evaluation (Signed)
Anesthesia Post Note  Patient: Morgan Riley  Procedure(s) Performed: COLONOSCOPY WITH PROPOFOL (N/A )  Patient location during evaluation: Endoscopy Anesthesia Type: General Level of consciousness: awake and alert Pain management: pain level controlled Vital Signs Assessment: post-procedure vital signs reviewed and stable Respiratory status: spontaneous breathing and respiratory function stable Cardiovascular status: stable Anesthetic complications: no     Last Vitals:  Vitals:   08/06/17 0723 08/06/17 0812  BP: 126/73 108/75  Pulse: 88 82  Resp: 17 17  Temp: (!) 35.9 C 36.5 C  SpO2: 100% 100%    Last Pain:  Vitals:   08/06/17 0812  TempSrc: Tympanic                 KEPHART,WILLIAM K

## 2017-08-06 NOTE — Transfer of Care (Signed)
Immediate Anesthesia Transfer of Care Note  Patient: Morgan Riley  Procedure(s) Performed: COLONOSCOPY WITH PROPOFOL (N/A )  Patient Location: PACU  Anesthesia Type:General  Level of Consciousness: awake, alert , oriented and patient cooperative  Airway & Oxygen Therapy: Patient Spontanous Breathing and Patient connected to nasal cannula oxygen  Post-op Assessment: Report given to RN and Post -op Vital signs reviewed and stable  Post vital signs: Reviewed and stable  Last Vitals:  Vitals:   08/06/17 0723 08/06/17 0812  BP: 126/73 108/75  Pulse: 88 82  Resp: 17 17  Temp: (!) 35.9 C 36.5 C  SpO2: 100% 100%    Last Pain:  Vitals:   08/06/17 0812  TempSrc: Tympanic         Complications: No apparent anesthesia complications

## 2017-08-06 NOTE — Anesthesia Post-op Follow-up Note (Signed)
Anesthesia QCDR form completed.        

## 2017-08-06 NOTE — Op Note (Addendum)
[facility] Gastroenterology Patient Name: Morgan Riley Procedure Date: 08/06/2017 7:44 AM MRN: [id number] Account #: [id number] Date of Birth: 17-Nov-1955 Admit Type: Outpatient Age: 61 Room: [facility] ENDO ROOM 4 Gender: Female Note Status: Finalized Procedure:            Colonoscopy Indications:          High risk colon cancer surveillance: Personal history                        of colonic polyps Providers:            Lucilla Lame MD, MD Referring MD:         Pleas Koch (Referring MD) Medicines:            Propofol per Anesthesia Complications:        No immediate complications. Procedure:            Pre-Anesthesia Assessment:                       - Prior to the procedure, a History and Physical was                        performed, and patient medications and allergies were                        reviewed. The patient's tolerance of previous                        anesthesia was also reviewed. The risks and benefits of                        the procedure and the sedation options and risks were                        discussed with the patient. All questions were                        answered, and informed consent was obtained. Prior                        Anticoagulants: The patient has taken no previous                        anticoagulant or antiplatelet agents. ASA Grade                        Assessment: II - A patient with mild systemic disease.                        After reviewing the risks and benefits, the patient was                        deemed in satisfactory condition to undergo the                        procedure.                       After obtaining informed consent, the colonoscope was  passed under direct vision. Throughout the procedure,                        the patient's blood pressure, pulse, and oxygen                        saturations were monitored continuously. The                        Colonoscope  was introduced through the anus and                        advanced to the the cecum, identified by appendiceal                        orifice and ileocecal valve. The colonoscopy was                        performed without difficulty. The patient tolerated the                        procedure well. The quality of the bowel preparation                        was excellent. Findings:      The perianal and digital rectal examinations were normal.      A 3 mm polyp was found in the cecum. The polyp was sessile. The polyp       was removed with a cold biopsy forceps. Resection and retrieval were       complete.      A 3 mm polyp was found in the ascending colon. The polyp was sessile.       The polyp was removed with a cold biopsy forceps. Resection and       retrieval were complete.      A 5 mm polyp was found in the transverse colon. The polyp was sessile.       The polyp was removed with a cold snare. Resection and retrieval were       complete.      A 3 mm polyp was found in the descending colon. The polyp was sessile.       The polyp was removed with a cold biopsy forceps. Resection and       retrieval were complete.      A few small-mouthed diverticula were found in the entire colon.      Internal hemorrhoids were found during retroflexion. The hemorrhoids       were Grade II (internal hemorrhoids that prolapse but reduce       spontaneously). Impression:           - One 3 mm polyp in the cecum, removed with a cold                        biopsy forceps. Resected and retrieved.                       - One 3 mm polyp in the ascending colon, removed with a                        cold biopsy forceps. Resected and retrieved.                       -  One 5 mm polyp in the transverse colon, removed with                        a cold snare. Resected and retrieved.                       - One 3 mm polyp in the descending colon, removed with                        a cold biopsy forceps.  Resected and retrieved.                       - Diverticulosis in the entire examined colon.                       - Internal hemorrhoids. Recommendation:       - Discharge patient to home.                       - Resume previous diet.                       - Continue present medications.                       - Await pathology results.                       - Repeat colonoscopy in 5 years for surveillance. Procedure Code(s):    --- Professional ---                       [phone], Colonoscopy, flexible; with removal of tumor(s),                        polyp(s), or other lesion(s) by snare technique                       [id number], 69, Colonoscopy, flexible; with biopsy, single                        or multiple Diagnosis Code(s):    --- Professional ---                       Z86.010, Personal history of colonic polyps                       D12.0, Benign neoplasm of cecum                       D12.2, Benign neoplasm of ascending colon                       D12.3, Benign neoplasm of transverse colon (hepatic                        flexure or splenic flexure)                       D12.4, Benign neoplasm of descending colon CPT copyright 2016 American Medical Association. All rights reserved. The codes documented in this report are preliminary and upon coder review may  be revised to meet  current compliance requirements. Lucilla Lame MD, MD 08/06/2017 8:11:35 AM This report has been signed electronically. Number of Addenda: 0 Note Initiated On: 08/06/2017 7:44 AM Scope Withdrawal Time: 0 hours 10 minutes 34 seconds  Total Procedure Duration: 0 hours 14 minutes 40 seconds       [facility]

## 2017-08-06 NOTE — Anesthesia Procedure Notes (Signed)
Date/Time: 08/06/2017 7:50 AM Performed by: Darlyne Russian Pre-anesthesia Checklist: Patient identified, Emergency Drugs available, Suction available, Patient being monitored and Timeout performed Patient Re-evaluated:Patient Re-evaluated prior to induction Oxygen Delivery Method: Nasal cannula Placement Confirmation: positive ETCO2

## 2017-08-06 NOTE — Anesthesia Preprocedure Evaluation (Signed)
Anesthesia Evaluation  Patient identified by MRN, date of birth, ID band Patient awake    Reviewed: Allergy & Precautions, NPO status , Patient's Chart, lab work & pertinent test results  History of Anesthesia Complications Negative for: history of anesthetic complications  Airway Mallampati: II       Dental   Pulmonary neg sleep apnea, neg COPD, Current Smoker,           Cardiovascular hypertension, Pt. on medications      Neuro/Psych neg Seizures Anxiety Depression    GI/Hepatic Neg liver ROS, GERD  Medicated,  Endo/Other  neg diabetes  Renal/GU negative Renal ROS     Musculoskeletal   Abdominal   Peds  Hematology   Anesthesia Other Findings   Reproductive/Obstetrics                             Anesthesia Physical Anesthesia Plan  ASA: II  Anesthesia Plan: General   Post-op Pain Management:    Induction:   PONV Risk Score and Plan: Propofol infusion  Airway Management Planned: Nasal Cannula  Additional Equipment:   Intra-op Plan:   Post-operative Plan:   Informed Consent: I have reviewed the patients History and Physical, chart, labs and discussed the procedure including the risks, benefits and alternatives for the proposed anesthesia with the patient or authorized representative who has indicated his/her understanding and acceptance.     Plan Discussed with:   Anesthesia Plan Comments:         Anesthesia Quick Evaluation

## 2017-08-06 NOTE — H&P (Signed)
Lucilla Lame, MD [facility] [address], [place], [place] [address] Phone:[phone] Fax : [phone]  Primary Care Physician:  Pleas Koch, NP Primary Gastroenterologist:  Dr. Allen Norris  Pre-Procedure History & Physical: HPI:  Morgan Riley is a 61 y.o. female is here for an colonoscopy.   Past Medical History:  Diagnosis Date  . Allergy   . Arthritis   . Carpal tunnel syndrome   . Depression   . GERD (gastroesophageal reflux disease)   . Hyperlipidemia   . Hypertension   . Mitral valve prolapse     Past Surgical History:  Procedure Laterality Date  . [place]  . tumor remove in right leg      Prior to Admission medications   Medication Sig Start Date End Date Taking? Authorizing Provider  acetaminophen (TYLENOL) 325 MG tablet Take 650 mg by mouth every 6 (six) hours as needed.    [provider]  amoxicillin (AMOXIL) 875 MG tablet TAKE 1 TABLET BY MOUTH THREE TIMES A DAY TODAY THEN TWICE A DAY UNTIL GONE 03/28/17   [provider]  amoxicillin-clavulanate (AUGMENTIN) 875-125 MG tablet Take 1 tablet by mouth 2 (two) times daily. Patient not taking: Reported on 07/05/2017 07/02/17   Pleas Koch, NP  gabapentin (NEURONTIN) 300 MG capsule Take by mouth. 09/25/13   [provider]  hydrOXYzine (ATARAX/VISTARIL) 10 MG tablet Take 1 tablet in the morning as needed for anxiety and take 1-2 tablets at bedtime as needed for anxiety. 07/16/17   Pleas Koch, NP  lisinopril (PRINIVIL,ZESTRIL) 20 MG tablet Take 20 mg by mouth daily. 03/19/17   [provider]  omeprazole (PRILOSEC) 20 MG capsule Take 20 mg by mouth daily. 01/12/17   [provider]  sertraline (ZOLOFT) 100 MG tablet Take 1 tablet (100 mg total) by mouth daily. 06/13/17   Pleas Koch, NP  simvastatin (ZOCOR) 40 MG tablet TAKE 1 TABLET (40 MG TOTAL) BY MOUTH NIGHTLY. 02/10/14   [provider]  varenicline (CHANTIX CONTINUING MONTH  PAK) 1 MG tablet Take 1 tablet (1 mg total) by mouth 2 (two) times daily. 07/05/17   Minna Merritts, MD    Allergies as of 07/19/2017  . (No Known Allergies)    Family History  Problem Relation Age of Onset  . Arthritis Mother   . Stroke Mother   . Hypertension Mother   . Mental illness Mother   . Diabetes Mother   . Arthritis Father   . Heart disease Father   . Hypertension Father   . Mental illness Sister   . Mental illness Sister     Social History   Social History  . Marital status: Married    Spouse name: N/A  . Number of children: N/A  . Years of education: N/A   Occupational History  . Not on file.   Social History Main Topics  . Smoking status: Current Every Day Smoker    Packs/day: 1.00    Years: 35.00  . Smokeless tobacco: Never Used  . Alcohol use No  . Drug use: No  . Sexual activity: Not on file   Other Topics Concern  . Not on file   Social History Narrative  . No narrative on file    Review of Systems: See HPI, otherwise negative ROS  Physical Exam: BP 126/73   Pulse 88   Temp (!) 96.7 F (35.9 C) (Tympanic)   Resp 17   Ht 5' (1.524 m)  Wt 133 lb (60.3 kg)   SpO2 100%   BMI 25.97 kg/m  General:   Alert,  pleasant and cooperative in NAD Head:  Normocephalic and atraumatic. Neck:  Supple; no masses or thyromegaly. Lungs:  Clear throughout to auscultation.    Heart:  Regular rate and rhythm. Abdomen:  Soft, nontender and nondistended. Normal bowel sounds, without guarding, and without rebound.   Neurologic:  Alert and  oriented x4;  grossly normal neurologically.  Impression/Plan: Morgan Riley is here for an colonoscopy to be performed for history of colon polyps  Risks, benefits, limitations, and alternatives regarding  colonoscopy have been reviewed with the patient.  Questions have been answered.  All parties agreeable.   Lucilla Lame, MD  08/06/2017, 7:48 AM

## 2017-08-07 ENCOUNTER — Encounter: Payer: Self-pay | Admitting: Gastroenterology

## 2017-08-07 LAB — SURGICAL PATHOLOGY

## 2017-08-08 ENCOUNTER — Encounter: Payer: Self-pay | Admitting: Gastroenterology

## 2017-08-19 ENCOUNTER — Ambulatory Visit
Admission: RE | Admit: 2017-08-19 | Discharge: 2017-08-19 | Disposition: A | Discharge status: Home / Self Care | Payer: BLUE CROSS/BLUE SHIELD | Source: Ambulatory Visit | Attending: Primary Care | Admitting: Primary Care

## 2017-08-19 DIAGNOSIS — Z1239 Encounter for other screening for malignant neoplasm of breast: Secondary | ICD-10-CM

## 2017-08-19 DIAGNOSIS — Z1231 Encounter for screening mammogram for malignant neoplasm of breast: Secondary | ICD-10-CM | POA: Diagnosis not present

## 2017-08-22 ENCOUNTER — Other Ambulatory Visit: Payer: Self-pay | Admitting: Primary Care

## 2017-08-22 DIAGNOSIS — I10 Essential (primary) hypertension: Secondary | ICD-10-CM

## 2017-08-22 DIAGNOSIS — E782 Mixed hyperlipidemia: Secondary | ICD-10-CM

## 2017-08-27 ENCOUNTER — Other Ambulatory Visit (INDEPENDENT_AMBULATORY_CARE_PROVIDER_SITE_OTHER): Payer: BLUE CROSS/BLUE SHIELD

## 2017-08-27 DIAGNOSIS — I10 Essential (primary) hypertension: Secondary | ICD-10-CM | POA: Diagnosis not present

## 2017-08-27 DIAGNOSIS — E782 Mixed hyperlipidemia: Secondary | ICD-10-CM | POA: Diagnosis not present

## 2017-08-27 LAB — COMPREHENSIVE METABOLIC PANEL
ALBUMIN: 4.2 g/dL (ref 3.5–5.2)
ALK PHOS: 72 U/L (ref 39–117)
ALT: 12 U/L (ref 0–35)
AST: 17 U/L (ref 0–37)
BUN: 14 mg/dL (ref 6–23)
CHLORIDE: 105 meq/L (ref 96–112)
CO2: 31 mEq/L (ref 19–32)
CREATININE: 0.75 mg/dL (ref 0.40–1.20)
Calcium: 10.2 mg/dL (ref 8.4–10.5)
GFR: 82.89 mL/min (ref >60.00)
Glucose, Bld: 94 mg/dL (ref 70–99)
Potassium: 4 mEq/L (ref 3.5–5.1)
SODIUM: 140 meq/L (ref 135–145)
TOTAL PROTEIN: 7.1 g/dL (ref 6.0–8.3)
Total Bilirubin: 0.4 mg/dL (ref 0.2–1.2)

## 2017-08-27 LAB — LIPID PANEL
CHOLESTEROL: 181 mg/dL (ref 0–200)
HDL: 56.8 mg/dL (ref >39.00)
LDL Cholesterol: 107 mg/dL — ABNORMAL HIGH (ref 0–99)
NonHDL: 124.41
Total CHOL/HDL Ratio: 3
Triglycerides: 87 mg/dL (ref 0.0–149.0)
VLDL: 17.4 mg/dL (ref 0.0–40.0)

## 2017-08-27 LAB — HEMOGLOBIN A1C: HEMOGLOBIN A1C: 5.6 % (ref 4.6–6.5)

## 2017-08-28 ENCOUNTER — Encounter: Payer: Self-pay | Admitting: Primary Care

## 2017-08-28 ENCOUNTER — Ambulatory Visit (INDEPENDENT_AMBULATORY_CARE_PROVIDER_SITE_OTHER): Payer: BLUE CROSS/BLUE SHIELD | Admitting: Primary Care

## 2017-08-28 ENCOUNTER — Other Ambulatory Visit (HOSPITAL_COMMUNITY)
Admission: RE | Admit: 2017-08-28 | Discharge: 2017-08-28 | Disposition: A | Discharge status: Home / Self Care | Payer: BLUE CROSS/BLUE SHIELD | Source: Ambulatory Visit | Attending: Primary Care | Admitting: Primary Care

## 2017-08-28 VITALS — BP 120/76 | HR 57 | Temp 98.2°F | Ht 60.25 in | Wt 135.4 lb

## 2017-08-28 DIAGNOSIS — Z86010 Personal history of colonic polyps: Secondary | ICD-10-CM

## 2017-08-28 DIAGNOSIS — Z23 Encounter for immunization: Secondary | ICD-10-CM | POA: Diagnosis not present

## 2017-08-28 DIAGNOSIS — G4700 Insomnia, unspecified: Secondary | ICD-10-CM

## 2017-08-28 DIAGNOSIS — Z0000 Encounter for general adult medical examination without abnormal findings: Secondary | ICD-10-CM

## 2017-08-28 DIAGNOSIS — I10 Essential (primary) hypertension: Secondary | ICD-10-CM | POA: Diagnosis not present

## 2017-08-28 DIAGNOSIS — Z124 Encounter for screening for malignant neoplasm of cervix: Secondary | ICD-10-CM | POA: Insufficient documentation

## 2017-08-28 DIAGNOSIS — E782 Mixed hyperlipidemia: Secondary | ICD-10-CM | POA: Diagnosis not present

## 2017-08-28 DIAGNOSIS — M1990 Unspecified osteoarthritis, unspecified site: Secondary | ICD-10-CM | POA: Insufficient documentation

## 2017-08-28 DIAGNOSIS — M810 Age-related osteoporosis without current pathological fracture: Secondary | ICD-10-CM | POA: Diagnosis not present

## 2017-08-28 DIAGNOSIS — F411 Generalized anxiety disorder: Secondary | ICD-10-CM | POA: Diagnosis not present

## 2017-08-28 MED ORDER — ZOSTER VAC RECOMB ADJUVANTED 50 MCG/0.5ML IM SUSR: 0.5000 mL | Freq: Once | INTRAMUSCULAR | 1 refills | Status: AC

## 2017-08-28 MED ORDER — ZOSTER VAC RECOMB ADJUVANTED 50 MCG/0.5ML IM SUSR: 0.5000 mL | Freq: Once | INTRAMUSCULAR | 0 refills | Status: DC

## 2017-08-28 NOTE — Addendum Note (Signed)
Addended by: Jacqualin Combes on: 08/28/2017 11:07 AM   Modules accepted: Orders

## 2017-08-28 NOTE — Assessment & Plan Note (Signed)
Recent lipid panel unremarkable, continue simvastatin.

## 2017-08-28 NOTE — Assessment & Plan Note (Signed)
Managed on Zoloft 100 mg, improved. Using hydroxyzine PRN for anxiety. Continue same.

## 2017-08-28 NOTE — Assessment & Plan Note (Signed)
Currently managed on gabapentin and Tylenol, overall doing well. Would like handicap placard due to arthritic pain.

## 2017-08-28 NOTE — Patient Instructions (Signed)
You were provided with a pneumonia vaccination and flu vaccination today.  Call the [facility] to schedule your bone density scan.  We will notify you once we receive your Pap results.   It's important to improve your diet by reducing consumption of fast food, fried food, processed snack foods, sugary drinks. Increase consumption of fresh vegetables and fruits, whole grains, water.  Ensure you are drinking 64 ounces of water daily.  Start exercising. You should be getting 150 minutes of exercise weekly.  Take the shingles vaccination to your pharmacy after the new year.   Follow up in 1 year for your annual exam or sooner if needed.  It was a pleasure to see you today!

## 2017-08-28 NOTE — Assessment & Plan Note (Signed)
Stable in the office today, continue lisinopril 20 mg. BMP unremarkable.  

## 2017-08-28 NOTE — Progress Notes (Signed)
Subjective:    Patient ID: Morgan Riley, female    DOB: June 26, 1956, 61 y.o.   MRN: [id number]  HPI  Morgan Riley is a 61 year old female who presents today for complete physical.  Immunizations: -Tetanus: Completed within 10 years.  -Influenza: Due.  -Pneumonia: Completed over 5 years ago, due. -Shingles: Due  Diet: She endorses a poor diet.  Breakfast: Breakfast bar, yogurt, milk Lunch: Sandwich, chips Dinner: Meatloaf, salad, potatoes, green beans Snacks: Fruit, sandwich Desserts: Occasionally  Beverages: Milk, coffee, un-sweet tea, water  Exercise: She does not currently exercise Eye exam: Completed years ago Dental exam: Completes semi-annually Colonoscopy: Completed in 2018, due in 5 years Dexa: Completed in 2014, osteoporosis. Due. Pap Smear: Completed in 2015, due. Mammogram: Completed in November 2018   Review of Systems  Constitutional: Negative for unexpected weight change.  HENT: Negative for rhinorrhea.   Respiratory: Negative for cough and shortness of breath.   Cardiovascular: Negative for chest pain.  Gastrointestinal: Negative for constipation and diarrhea.  Genitourinary: Negative for difficulty urinating and menstrual problem.  Musculoskeletal: Positive for arthralgias. Negative for myalgias.  Skin: Negative for rash.  Allergic/Immunologic: Negative for environmental allergies.  Neurological: Negative for dizziness, numbness and headaches.  Psychiatric/Behavioral:       Doing well on Zoloft.       Past Medical History:  Diagnosis Date  . Allergy   . Arthritis   . Carpal tunnel syndrome   . Depression   . GERD (gastroesophageal reflux disease)   . Hyperlipidemia   . Hypertension   . Mitral valve prolapse      Social History   Socioeconomic History  . Marital status: Married    Spouse name: Not on file  . Number of children: Not on file  . Years of education: Not on file  . Highest education level: Not on file  Social Needs  .  Financial resource strain: Not on file  . Food insecurity - worry: Not on file  . Food insecurity - inability: Not on file  . Transportation needs - medical: Not on file  . Transportation needs - non-medical: Not on file  Occupational History  . Not on file  Tobacco Use  . Smoking status: Current Every Day Smoker    Packs/day: 1.00    Years: 35.00    Pack years: 35.00  . Smokeless tobacco: Never Used  Substance and Sexual Activity  . Alcohol use: No  . Drug use: No  . Sexual activity: Not on file  Other Topics Concern  . Not on file  Social History Narrative  . Not on file    Past Surgical History:  Procedure Laterality Date  . COLONOSCOPY WITH PROPOFOL N/A 08/06/2017   Procedure: COLONOSCOPY WITH PROPOFOL;  Surgeon: Lucilla Lame, MD;  Location: [facility] ENDOSCOPY;  Service: Endoscopy;  Laterality: N/A;  . [place]  . tumor remove in right leg      Family History  Problem Relation Age of Onset  . Arthritis Morgan Riley   . Stroke Morgan Riley   . Hypertension Morgan Riley   . Mental illness Morgan Riley   . Diabetes Morgan Riley   . Arthritis Morgan Riley   . Heart disease Morgan Riley   . Hypertension Morgan Riley   . Mental illness Morgan Riley   . Mental illness Morgan Riley   . Breast cancer Neg Hx     No Known Allergies  Current Outpatient Medications on File Prior to Visit  Medication Sig Dispense Refill  . acetaminophen (TYLENOL) 325 MG  tablet Take 650 mg by mouth every 6 (six) hours as needed.    . gabapentin (NEURONTIN) 300 MG capsule Take by mouth.    . hydrOXYzine (ATARAX/VISTARIL) 10 MG tablet Take 1 tablet in the morning as needed for anxiety and take 1-2 tablets at bedtime as needed for anxiety. 90 tablet 2  . lisinopril (PRINIVIL,ZESTRIL) 20 MG tablet Take 20 mg by mouth daily.  3  . omeprazole (PRILOSEC) 20 MG capsule Take 20 mg by mouth daily.  5  . sertraline (ZOLOFT) 100 MG tablet Take 1 tablet (100 mg total) by mouth daily. 90 tablet 0  . simvastatin (ZOCOR) 40 MG tablet TAKE 1 TABLET  (40 MG TOTAL) BY MOUTH NIGHTLY.    . varenicline (CHANTIX CONTINUING MONTH PAK) 1 MG tablet Take 1 tablet (1 mg total) by mouth 2 (two) times daily. (Patient not taking: Reported on 08/28/2017) 60 tablet 4   No current facility-administered medications on file prior to visit.     BP 120/76   Pulse (!) 57   Temp 98.2 F (36.8 C) (Oral)   Ht 5' 0.25" (1.53 m)   Wt 135 lb 6.4 oz (61.4 kg)   SpO2 97%   BMI 26.22 kg/m    Objective:   Physical Exam  Constitutional: She is oriented to person, place, and time. She appears well-nourished.  HENT:  Right Ear: Tympanic membrane and ear canal normal.  Left Ear: Tympanic membrane and ear canal normal.  Nose: Nose normal.  Mouth/Throat: Oropharynx is clear and moist.  Eyes: Conjunctivae and EOM are normal. Pupils are equal, round, and reactive to light.  Neck: Neck supple. No thyromegaly present.  Cardiovascular: Normal rate and regular rhythm.  No murmur heard. Pulmonary/Chest: Effort normal and breath sounds normal. She has no rales.  Abdominal: Soft. Bowel sounds are normal. There is no tenderness.  Genitourinary: There is no tenderness or lesion on the right labia. There is no tenderness or lesion on the left labia. Cervix exhibits no motion tenderness and no discharge. Right adnexum displays no tenderness. Left adnexum displays no tenderness. No erythema in the vagina. No vaginal discharge found.  Musculoskeletal: Normal range of motion.  Lymphadenopathy:    She has no cervical adenopathy.  Neurological: She is alert and oriented to person, place, and time. She has normal reflexes. No cranial nerve deficit.  Skin: Skin is warm and dry. No rash noted.  Psychiatric: She has a normal mood and affect.          Assessment & Plan:

## 2017-08-28 NOTE — Assessment & Plan Note (Signed)
Colonoscopy UTD, repeat in 2023.

## 2017-08-28 NOTE — Assessment & Plan Note (Signed)
TD UTD per patient. Influenza vaccination and pneumonia vaccination due, provided today. Rx printed for Shingrix. Mammogram UTD. Pap due, completed. Bone density due, ordered. Recommended regular exercise, increase vegetables/whole grains/lean protein/water. Follow up in 1 year.

## 2017-08-28 NOTE — Assessment & Plan Note (Signed)
Doing well, using hydroxyzine PRN.

## 2017-08-28 NOTE — Addendum Note (Signed)
Addended by: Jacqualin Combes on: 08/28/2017 03:06 PM   Modules accepted: Orders

## 2017-09-03 LAB — CYTOLOGY - PAP: HPV: NOT DETECTED

## 2017-09-04 ENCOUNTER — Encounter: Payer: Self-pay | Admitting: *Deleted

## 2017-09-09 ENCOUNTER — Other Ambulatory Visit: Payer: Self-pay | Admitting: Primary Care

## 2017-09-09 DIAGNOSIS — F411 Generalized anxiety disorder: Secondary | ICD-10-CM

## 2017-10-18 ENCOUNTER — Other Ambulatory Visit: Payer: Self-pay | Admitting: Primary Care

## 2017-10-18 DIAGNOSIS — F411 Generalized anxiety disorder: Secondary | ICD-10-CM

## 2017-12-13 ENCOUNTER — Other Ambulatory Visit: Payer: Self-pay | Admitting: Primary Care

## 2017-12-13 DIAGNOSIS — F411 Generalized anxiety disorder: Secondary | ICD-10-CM

## 2018-03-21 ENCOUNTER — Other Ambulatory Visit: Payer: Self-pay | Admitting: Primary Care

## 2018-03-21 DIAGNOSIS — F411 Generalized anxiety disorder: Secondary | ICD-10-CM

## 2018-03-21 NOTE — Telephone Encounter (Signed)
Noted, refill sent to pharmacy. 

## 2018-03-21 NOTE — Telephone Encounter (Signed)
Ok to refill? Electronically refill request for hydrOXYzine (ATARAX/VISTARIL) 10 MG tablet  Last prescribed on 10/18/2017  Last seen on 08/28/2017

## 2018-06-14 ENCOUNTER — Telehealth: Payer: Self-pay

## 2018-06-14 NOTE — Telephone Encounter (Signed)
Call pt regarding lung screening scan  Left message at 2:06 on 06-14-18.

## 2018-06-17 ENCOUNTER — Telehealth: Payer: Self-pay | Admitting: Nurse Practitioner

## 2018-06-18 ENCOUNTER — Other Ambulatory Visit: Payer: Self-pay | Admitting: *Deleted

## 2018-06-18 DIAGNOSIS — Z122 Encounter for screening for malignant neoplasm of respiratory organs: Secondary | ICD-10-CM

## 2018-06-20 ENCOUNTER — Encounter: Payer: Self-pay | Admitting: *Deleted

## 2018-06-20 ENCOUNTER — Telehealth: Payer: Self-pay | Admitting: *Deleted

## 2018-06-20 NOTE — Telephone Encounter (Signed)
Attempted to contact patient r/t LDCT Screening follow up due at this time. Attempted to contact patient r/t LDCT Screening follow up due at this time.  No answer received, unable to leave message at this time, will attempt contact at later date.   

## 2018-06-24 ENCOUNTER — Telehealth: Payer: Self-pay | Admitting: *Deleted

## 2018-06-24 NOTE — Telephone Encounter (Signed)
Called patient and ask her about doing ldct screening pt was good with doing the follow up and appt given for ldct screeing on Monday 06/30/2018 @ 1:15pm here @ [facility] Voiced understanding.

## 2018-06-30 ENCOUNTER — Ambulatory Visit
Admission: RE | Admit: 2018-06-30 | Discharge: 2018-06-30 | Disposition: A | Discharge status: Home / Self Care | Payer: BLUE CROSS/BLUE SHIELD | Source: Ambulatory Visit | Attending: Oncology | Admitting: Oncology

## 2018-06-30 DIAGNOSIS — I700 Atherosclerosis of aorta: Secondary | ICD-10-CM | POA: Insufficient documentation

## 2018-06-30 DIAGNOSIS — J432 Centrilobular emphysema: Secondary | ICD-10-CM | POA: Insufficient documentation

## 2018-06-30 DIAGNOSIS — Z122 Encounter for screening for malignant neoplasm of respiratory organs: Secondary | ICD-10-CM | POA: Diagnosis not present

## 2018-06-30 DIAGNOSIS — Z87891 Personal history of nicotine dependence: Secondary | ICD-10-CM | POA: Insufficient documentation

## 2018-06-30 DIAGNOSIS — I2510 Atherosclerotic heart disease of native coronary artery without angina pectoris: Secondary | ICD-10-CM | POA: Diagnosis not present

## 2018-07-01 ENCOUNTER — Encounter: Payer: Self-pay | Admitting: *Deleted

## 2018-07-25 ENCOUNTER — Other Ambulatory Visit: Payer: Self-pay | Admitting: Primary Care

## 2018-07-25 DIAGNOSIS — F411 Generalized anxiety disorder: Secondary | ICD-10-CM

## 2018-08-25 ENCOUNTER — Other Ambulatory Visit: Payer: Self-pay | Admitting: Primary Care

## 2018-08-25 DIAGNOSIS — E782 Mixed hyperlipidemia: Secondary | ICD-10-CM

## 2018-09-02 ENCOUNTER — Other Ambulatory Visit (INDEPENDENT_AMBULATORY_CARE_PROVIDER_SITE_OTHER): Payer: BLUE CROSS/BLUE SHIELD

## 2018-09-02 DIAGNOSIS — E782 Mixed hyperlipidemia: Secondary | ICD-10-CM

## 2018-09-02 LAB — COMPREHENSIVE METABOLIC PANEL
ALK PHOS: 76 U/L (ref 39–117)
ALT: 12 U/L (ref 0–35)
AST: 13 U/L (ref 0–37)
Albumin: 4.5 g/dL (ref 3.5–5.2)
BILIRUBIN TOTAL: 0.4 mg/dL (ref 0.2–1.2)
BUN: 9 mg/dL (ref 6–23)
CALCIUM: 10.2 mg/dL (ref 8.4–10.5)
CO2: 30 mEq/L (ref 19–32)
Chloride: 105 mEq/L (ref 96–112)
Creatinine, Ser: 0.73 mg/dL (ref 0.40–1.20)
GFR: 85.24 mL/min (ref >60.00)
Glucose, Bld: 97 mg/dL (ref 70–99)
Potassium: 4.3 mEq/L (ref 3.5–5.1)
Sodium: 141 mEq/L (ref 135–145)
Total Protein: 7.6 g/dL (ref 6.0–8.3)

## 2018-09-02 LAB — LIPID PANEL
CHOL/HDL RATIO: 3
Cholesterol: 168 mg/dL (ref 0–200)
HDL: 53.9 mg/dL (ref >39.00)
LDL Cholesterol: 94 mg/dL (ref 0–99)
NonHDL: 113.63
TRIGLYCERIDES: 96 mg/dL (ref 0.0–149.0)
VLDL: 19.2 mg/dL (ref 0.0–40.0)

## 2018-09-09 ENCOUNTER — Encounter: Payer: Self-pay | Admitting: Primary Care

## 2018-09-09 ENCOUNTER — Ambulatory Visit (INDEPENDENT_AMBULATORY_CARE_PROVIDER_SITE_OTHER): Payer: BLUE CROSS/BLUE SHIELD | Admitting: Primary Care

## 2018-09-09 VITALS — BP 120/84 | HR 78 | Temp 98.0°F | Ht 62.0 in | Wt 142.8 lb

## 2018-09-09 DIAGNOSIS — I10 Essential (primary) hypertension: Secondary | ICD-10-CM

## 2018-09-09 DIAGNOSIS — G4700 Insomnia, unspecified: Secondary | ICD-10-CM

## 2018-09-09 DIAGNOSIS — M1990 Unspecified osteoarthritis, unspecified site: Secondary | ICD-10-CM

## 2018-09-09 DIAGNOSIS — Z23 Encounter for immunization: Secondary | ICD-10-CM | POA: Diagnosis not present

## 2018-09-09 DIAGNOSIS — F411 Generalized anxiety disorder: Secondary | ICD-10-CM | POA: Diagnosis not present

## 2018-09-09 DIAGNOSIS — Z0000 Encounter for general adult medical examination without abnormal findings: Secondary | ICD-10-CM | POA: Diagnosis not present

## 2018-09-09 DIAGNOSIS — Z720 Tobacco use: Secondary | ICD-10-CM

## 2018-09-09 DIAGNOSIS — E782 Mixed hyperlipidemia: Secondary | ICD-10-CM

## 2018-09-09 MED ORDER — GABAPENTIN 300 MG PO CAPS: 300.0000 mg | ORAL_CAPSULE | Freq: Two times a day (BID) | ORAL | 3 refills | Status: AC

## 2018-09-09 MED ORDER — VARENICLINE TARTRATE 0.5 MG X 11 & 1 MG X 42 PO MISC: ORAL | 0 refills | Status: DC

## 2018-09-09 MED ORDER — ZOSTER VAC RECOMB ADJUVANTED 50 MCG/0.5ML IM SUSR: 0.5000 mL | Freq: Once | INTRAMUSCULAR | 1 refills | Status: AC

## 2018-09-09 NOTE — Progress Notes (Signed)
Subjective:    Patient ID: Morgan Riley, female    DOB: [birth year], 62 y.o.   MRN: [id number]  HPI  Ms. Morgan Riley is a 62 year old female who presents today for complete physical.  Immunizations: -Tetanus: Completed over 10 years ago.  -Influenza: Due today -Pneumonia: Completed in 2018 -Shingles: Never completed   Diet: She endorses a fair diet Breakfast: Fruit, oatmeal, cream of wheat Lunch: Sandwich, soup Dinner: Salad, meat, starch  Snacks: Fruit with peanut butter Desserts: Once weekly  Beverages: Milk, water, coffee, occasional diet tea  Exercise: She is not exercising, she is active Eye exam: Completed 3-4 years ago Dental exam: Completes every 6 months Colonoscopy: Completed in 2018, due in 2021 Pap Smear: Completed in 2018 Mammogram: Completed in 2018, prefers to do in 2020  BP Readings from Last 3 Encounters:  09/09/18 120/84  08/28/17 120/76  08/06/17 (!) 145/106     Review of Systems  Constitutional: Negative for unexpected weight change.  HENT: Negative for rhinorrhea.   Respiratory: Negative for cough and shortness of breath.   Cardiovascular: Negative for chest pain.  Gastrointestinal: Negative for constipation and diarrhea.  Genitourinary: Negative for difficulty urinating.  Musculoskeletal: Positive for arthralgias.  Skin: Negative for rash.  Allergic/Immunologic: Negative for environmental allergies.  Neurological: Negative for dizziness, numbness and headaches.  Psychiatric/Behavioral: The patient is not nervous/anxious.        Past Medical History:  Diagnosis Date  . Allergy   . Arthritis   . Carpal tunnel syndrome   . Depression   . GERD (gastroesophageal reflux disease)   . Hyperlipidemia   . Hypertension   . Mitral valve prolapse      Social History   Socioeconomic History  . Marital status: Married    Spouse name: Not on file  . Number of children: Not on file  . Years of education: Not on file  . Highest education level: Not  on file  Occupational History  . Not on file  Social Needs  . Financial resource strain: Not on file  . Food insecurity:    Worry: Not on file    Inability: Not on file  . Transportation needs:    Medical: Not on file    Non-medical: Not on file  Tobacco Use  . Smoking status: Current Every Day Smoker    Packs/day: 1.00    Years: 35.00    Pack years: 35.00  . Smokeless tobacco: Never Used  Substance and Sexual Activity  . Alcohol use: No  . Drug use: No  . Sexual activity: Not on file  Lifestyle  . Physical activity:    Days per week: Not on file    Minutes per session: Not on file  . Stress: Not on file  Relationships  . Social connections:    Talks on phone: Not on file    Gets together: Not on file    Attends religious service: Not on file    Active member of club or organization: Not on file    Attends meetings of clubs or organizations: Not on file    Relationship status: Not on file  . Intimate partner violence:    Fear of current or ex partner: Not on file    Emotionally abused: Not on file    Physically abused: Not on file    Forced sexual activity: Not on file  Other Topics Concern  . Not on file  Social History Narrative  . Not on file  Past Surgical History:  Procedure Laterality Date  . COLONOSCOPY WITH PROPOFOL N/A 08/06/2017   Procedure: COLONOSCOPY WITH PROPOFOL;  Surgeon: Lucilla Lame, MD;  Location: [facility] ENDOSCOPY;  Service: Endoscopy;  Laterality: N/A;  . [place]  . tumor remove in right leg      Family History  Problem Relation Age of Onset  . Arthritis Mother   . Stroke Mother   . Hypertension Mother   . Mental illness Mother   . Diabetes Mother   . Arthritis Father   . Heart disease Father   . Hypertension Father   . Mental illness Sister   . Mental illness Sister   . Breast cancer Neg Hx     No Known Allergies  Current Outpatient Medications on File Prior to Visit  Medication Sig Dispense Refill  .  acetaminophen (TYLENOL) 325 MG tablet Take 650 mg by mouth every 6 (six) hours as needed.    . hydrOXYzine (ATARAX/VISTARIL) 10 MG tablet TAKE 1 TABLET BY MOUTH EVERY MORNING AS NEEDED FOR ANXIETY. AND 1-2 TABLETS AT BEDTIME 270 tablet 0  . lisinopril (PRINIVIL,ZESTRIL) 20 MG tablet Take 20 mg by mouth daily.  3  . omeprazole (PRILOSEC) 20 MG capsule Take 20 mg by mouth daily.  5  . sertraline (ZOLOFT) 100 MG tablet TAKE 1 TABLET (100 MG TOTAL) BY MOUTH DAILY. WILL NEED APPOINTMENT FOR ANY MORE REFILLS 30 tablet 0  . simvastatin (ZOCOR) 40 MG tablet TAKE 1 TABLET (40 MG TOTAL) BY MOUTH NIGHTLY.    . varenicline (CHANTIX CONTINUING MONTH PAK) 1 MG tablet Take 1 tablet (1 mg total) by mouth 2 (two) times daily. 60 tablet 4   No current facility-administered medications on file prior to visit.     BP 120/84   Pulse 78   Temp 98 F (36.7 C) (Oral)   Ht 5\' 2"  (1.575 m)   Wt 142 lb 12 oz (64.8 kg)   SpO2 98%   BMI 26.11 kg/m    Objective:   Physical Exam  Constitutional: She is oriented to person, place, and time. She appears well-nourished.  HENT:  Mouth/Throat: No oropharyngeal exudate.  Eyes: Pupils are equal, round, and reactive to light. EOM are normal.  Neck: Neck supple. No thyromegaly present.  Cardiovascular: Normal rate and regular rhythm.  Respiratory: Effort normal and breath sounds normal.  GI: Soft. Bowel sounds are normal. There is no tenderness.  Musculoskeletal: Normal range of motion.  Neurological: She is alert and oriented to person, place, and time.  Skin: Skin is warm and dry.  Psychiatric: She has a normal mood and affect.           Assessment & Plan:

## 2018-09-09 NOTE — Assessment & Plan Note (Signed)
Recent lipid panel unremarkable. Continue simvastatin. 

## 2018-09-09 NOTE — Assessment & Plan Note (Signed)
Doing well on gabapentin for which she takes 1-2 times daily. Continue same.

## 2018-09-09 NOTE — Assessment & Plan Note (Signed)
Tetanus and influenza due, provided today.  Pap smear UTD. Mammogram UTD. Would like to defer until next year. Colonoscopy UTD, due in 2021. Recommended regular exercise, healthy diet. Exam unremarkable. Labs reviewed. Follow up in 1 year for CPE.

## 2018-09-09 NOTE — Assessment & Plan Note (Signed)
Doing well on hydroxyzine. Continue same.

## 2018-09-09 NOTE — Assessment & Plan Note (Signed)
Doing well on Zoloft 100 mg and hydroxyzine BID. Continue same. Denies SI/HI.

## 2018-09-09 NOTE — Addendum Note (Signed)
Addended by: Jacqualin Combes on: 09/09/2018 05:20 PM   Modules accepted: Orders

## 2018-09-09 NOTE — Assessment & Plan Note (Signed)
Stable in the office today, continue lisinopril 20 mg. BMP reviewed.

## 2018-09-09 NOTE — Patient Instructions (Signed)
Start the Chantix starting pack for tobacco cessation, then continue with the continuing pack. Choose a quit date before you start the pack, this must be within the first 2 weeks of the medication initiation.  Take the shingles vaccination to your pharmacy.  Start exercising. You should be getting 150 minutes of moderate intensity exercise weekly.  It's important to improve your diet by reducing consumption of fast food, fried food, processed snack foods. Increase consumption of fresh vegetables and fruits, whole grains, water.  Ensure you are drinking 64 ounces of water daily.  We will see you in one year for your annual exam or sooner if needed.  Varenicline oral tablets What is this medicine? VARENICLINE (var EN i kleen) is used to help people quit smoking. It can reduce the symptoms caused by stopping smoking. It is used with a patient support program recommended by your physician. This medicine may be used for other purposes; ask your health care provider or pharmacist if you have questions. COMMON BRAND NAME(S): Chantix What should I tell my health care provider before I take this medicine? They need to know if you have any of these conditions: -bipolar disorder, depression, schizophrenia or other mental illness -heart disease -if you often drink alcohol -kidney disease -peripheral vascular disease -seizures -stroke -suicidal thoughts, plans, or attempt; a previous suicide attempt by you or a family member -an unusual or allergic reaction to varenicline, other medicines, foods, dyes, or preservatives -pregnant or trying to get pregnant -breast-feeding How should I use this medicine? Take this medicine by mouth after eating. Take with a full glass of water. Follow the directions on the prescription label. Take your doses at regular intervals. Do not take your medicine more often than directed. There are 3 ways you can use this medicine to help you quit smoking; talk to your  health care professional to decide which plan is right for you: 1) you can choose a quit date and start this medicine 1 week before the quit date, or, 2) you can start taking this medicine before you choose a quit date, and then pick a quit date between day 8 and 35 days of treatment, or, 3) if you are not sure that you are able or willing to quit smoking right away, start taking this medicine and slowly decrease the amount you smoke as directed by your health care professional with the goal of being cigarette-free by week 12 of treatment. Stick to your plan; ask about support groups or other ways to help you remain cigarette-free. If you are motivated to quit smoking and did not succeed during a previous attempt with this medicine for reasons other than side effects, or if you returned to smoking after this treatment, speak with your health care professional about whether another course of this medicine may be right for you. A special MedGuide will be given to you by the pharmacist with each prescription and refill. Be sure to read this information carefully each time. Talk to your pediatrician regarding the use of this medicine in children. This medicine is not approved for use in children. Overdosage: If you think you have taken too much of this medicine contact a poison control center or emergency room at once. NOTE: This medicine is only for you. Do not share this medicine with others. What if I miss a dose? If you miss a dose, take it as soon as you can. If it is almost time for your next dose, take only that dose. Do  not take double or extra doses. What may interact with this medicine? -alcohol or any product that contains alcohol -insulin -other stop smoking aids -theophylline -warfarin This list may not describe all possible interactions. Give your health care provider a list of all the medicines, herbs, non-prescription drugs, or dietary supplements you use. Also tell them if you smoke,  drink alcohol, or use illegal drugs. Some items may interact with your medicine. What should I watch for while using this medicine? Visit your doctor or health care professional for regular check ups. Ask for ongoing advice and encouragement from your doctor or healthcare professional, friends, and family to help you quit. If you smoke while on this medication, quit again Your mouth may get dry. Chewing sugarless gum or sucking hard candy, and drinking plenty of water may help. Contact your doctor if the problem does not go away or is severe. You may get drowsy or dizzy. Do not drive, use machinery, or do anything that needs mental alertness until you know how this medicine affects you. Do not stand or sit up quickly, especially if you are an older patient. This reduces the risk of dizzy or fainting spells. Sleepwalking can happen during treatment with this medicine, and can sometimes lead to behavior that is harmful to you, other people, or property. Stop taking this medicine and tell your doctor if you start sleepwalking or have other unusual sleep-related activity. Decrease the amount of alcoholic beverages that you drink during treatment with this medicine until you know if this medicine affects your ability to tolerate alcohol. Some people have experienced increased drunkenness (intoxication), unusual or sometimes aggressive behavior, or no memory of things that have happened (amnesia) during treatment with this medicine. The use of this medicine may increase the chance of suicidal thoughts or actions. Pay special attention to how you are responding while on this medicine. Any worsening of mood, or thoughts of suicide or dying should be reported to your health care professional right away. What side effects may I notice from receiving this medicine? Side effects that you should report to your doctor or health care professional as soon as possible: -allergic reactions like skin rash, itching or hives,  swelling of the face, lips, tongue, or throat -acting aggressive, being angry or violent, or acting on dangerous impulses -breathing problems -changes in vision -chest pain or chest tightness -confusion, trouble speaking or understanding -new or worsening depression, anxiety, or panic attacks -extreme increase in activity and talking (mania) -fast, irregular heartbeat -feeling faint or lightheaded, falls -fever -pain in legs when walking -problems with balance, talking, walking -redness, blistering, peeling or loosening of the skin, including inside the mouth -ringing in ears -seeing or hearing things that aren't there (hallucinations) -seizures -sleepwalking -sudden numbness or weakness of the face, arm or leg -thoughts about suicide or dying, or attempts to commit suicide -trouble passing urine or change in the amount of urine -unusual bleeding or bruising -unusually weak or tired Side effects that usually do not require medical attention (report to your doctor or health care professional if they continue or are bothersome): -constipation -headache -nausea, vomiting -strange dreams -stomach gas -trouble sleeping This list may not describe all possible side effects. Call your doctor for medical advice about side effects. You may report side effects to FDA at 1-800-FDA-1088. Where should I keep my medicine? Keep out of the reach of children. Store at room temperature between 15 and 30 degrees C (59 and 86 degrees F). Throw away any unused  medicine after the expiration date. NOTE: This sheet is a summary. It may not cover all possible information. If you have questions about this medicine, talk to your doctor, pharmacist, or health care provider.  2018 Elsevier/Gold Standard (2015-06-09 16:14:23)

## 2018-09-09 NOTE — Assessment & Plan Note (Signed)
Ready to quit and did well on Chantix. Will send starting pack, she has a continuing pack already. Lung cancer screening UTD.

## 2018-09-12 ENCOUNTER — Other Ambulatory Visit: Payer: Self-pay | Admitting: Primary Care

## 2018-09-12 DIAGNOSIS — F411 Generalized anxiety disorder: Secondary | ICD-10-CM

## 2018-10-17 ENCOUNTER — Other Ambulatory Visit: Payer: Self-pay | Admitting: Primary Care

## 2018-10-17 DIAGNOSIS — F411 Generalized anxiety disorder: Secondary | ICD-10-CM

## 2018-10-17 NOTE — Telephone Encounter (Signed)
Last prescribed on 03/21/2018  #270  with 0 refills. Last office visit on 09/09/2018. No future appointment.

## 2018-10-19 NOTE — Telephone Encounter (Signed)
Noted, refill sent to pharmacy. 

## 2019-01-01 IMAGING — CT CT CHEST LUNG CANCER SCREENING LOW DOSE W/O CM
1 series · 15 of 31 positions shown, 19 images · non-contrast
Comparison: Chest radiograph 12/27/2008.  No prior CT.

CLINICAL DATA: Thirty-five pack-year smoking history. Asymptomatic
current smoker.

EXAM:
CT CHEST WITHOUT CONTRAST LOW-DOSE FOR LUNG CANCER SCREENING
TECHNIQUE: Multidetector CT imaging of the chest was performed following the
standard protocol without IV contrast.

[Series 2: axial st · axial · 0.66mm/px · z∈[-572,-306]mm · 15 of 59 slices shown, 19 images]
[im 3/59  mediastinal]
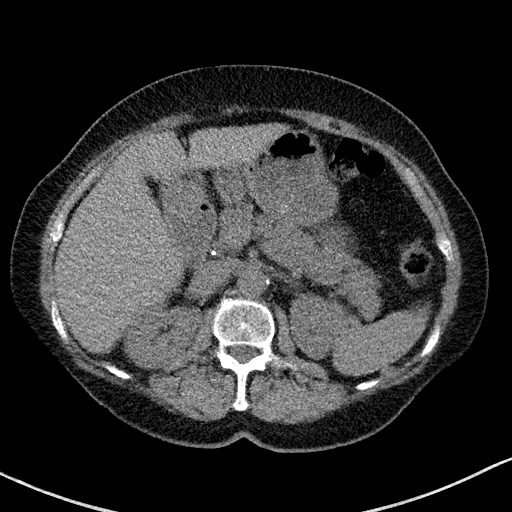
[im 3/59  lung]
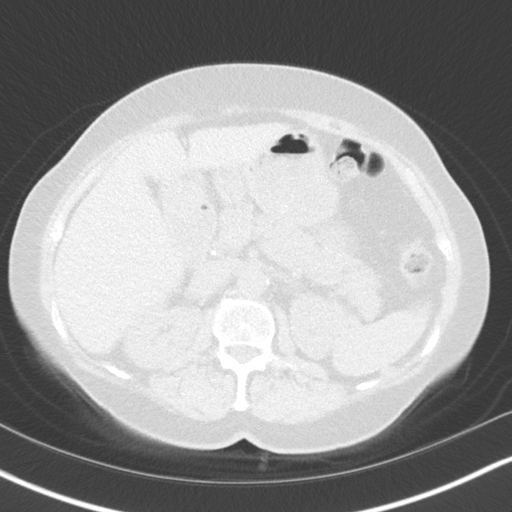
[im 7/59  lung]
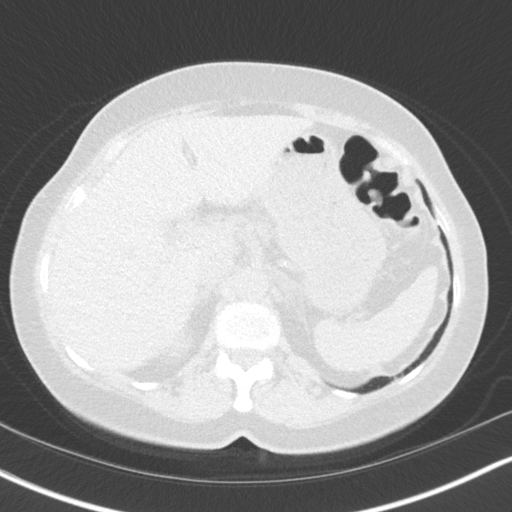
[im 11/59  lung]
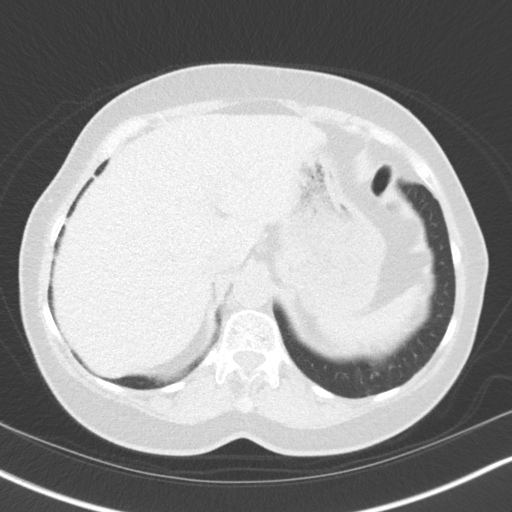
[im 13/59  lung]
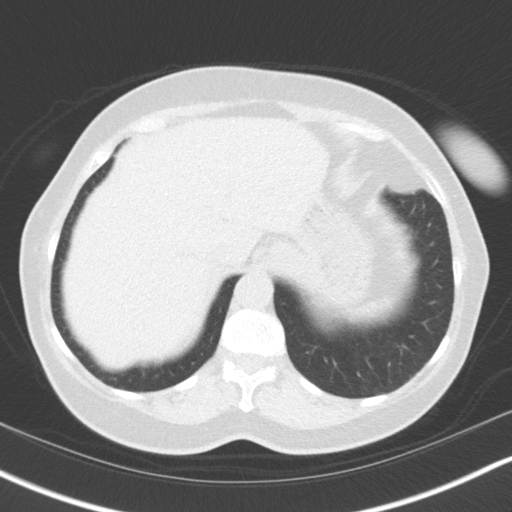
[im 18/59  mediastinal]
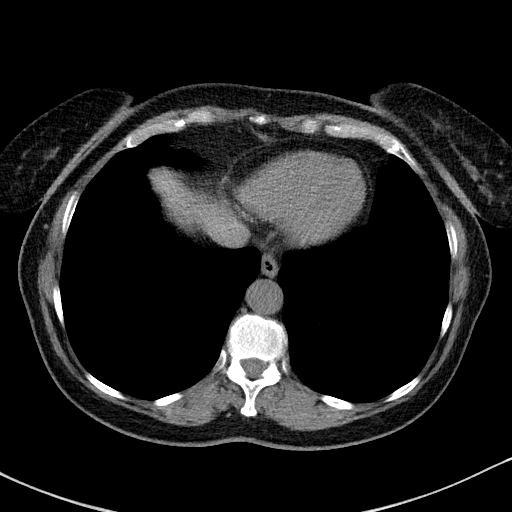
[im 18/59  lung]
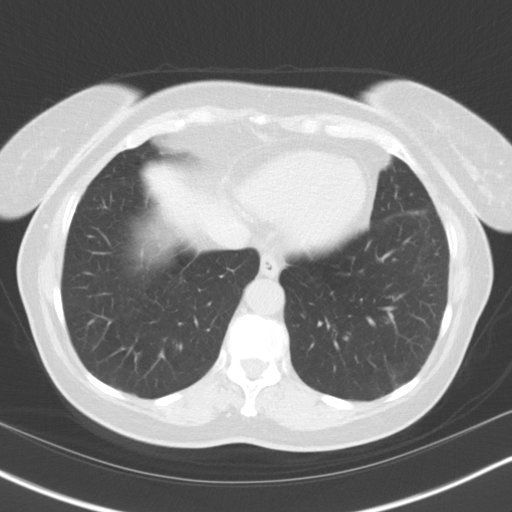
[im 22/59  lung]
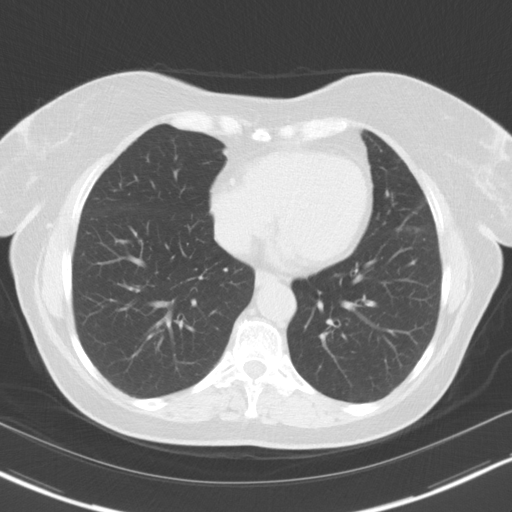
[im 26/59  lung]
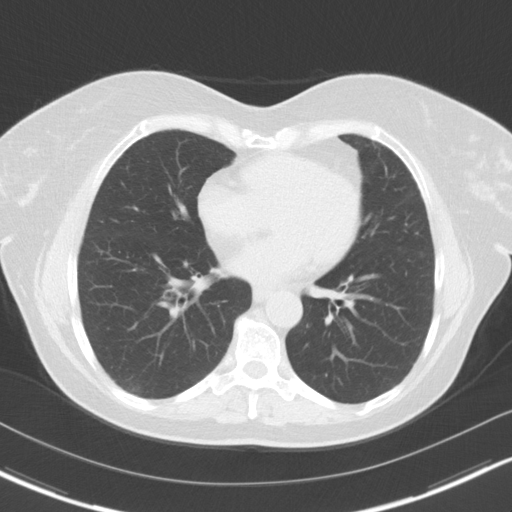
[im 31/59  lung]
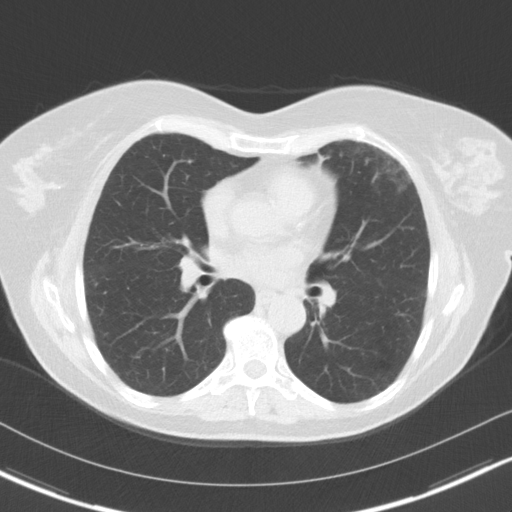
[im 33/59  mediastinal]
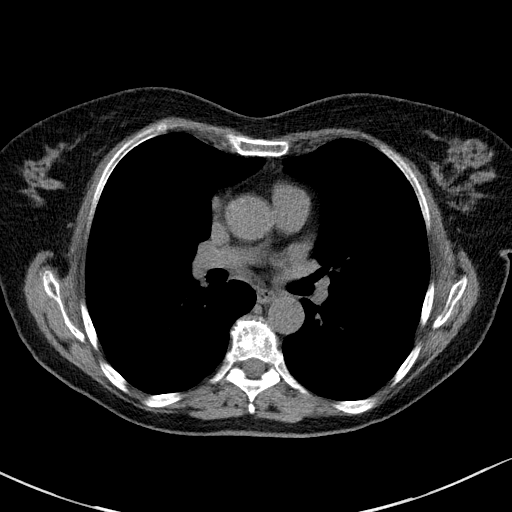
[im 33/59  lung]
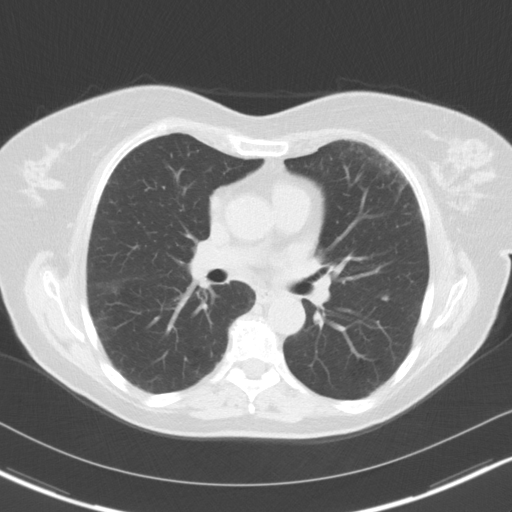
[im 37/59  lung]
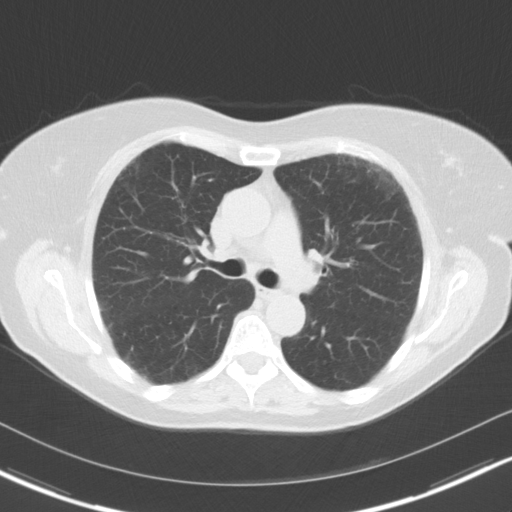
[im 41/59  lung]
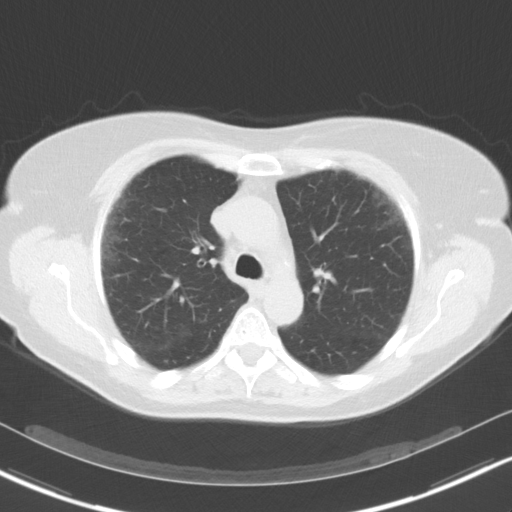
[im 46/59  lung]
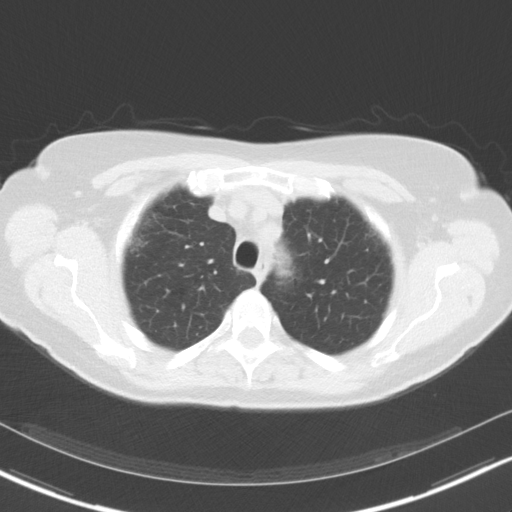
[im 48/59  mediastinal]
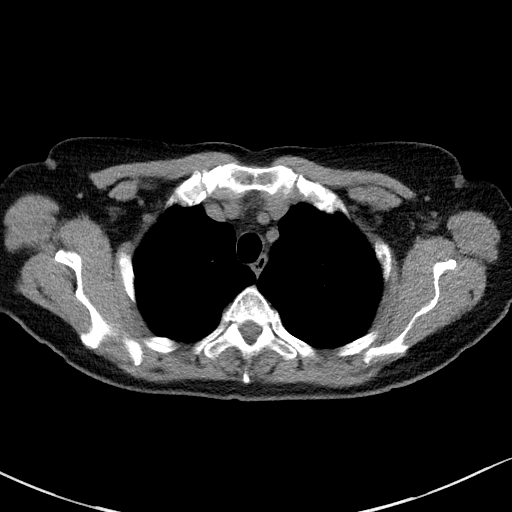
[im 48/59  lung]
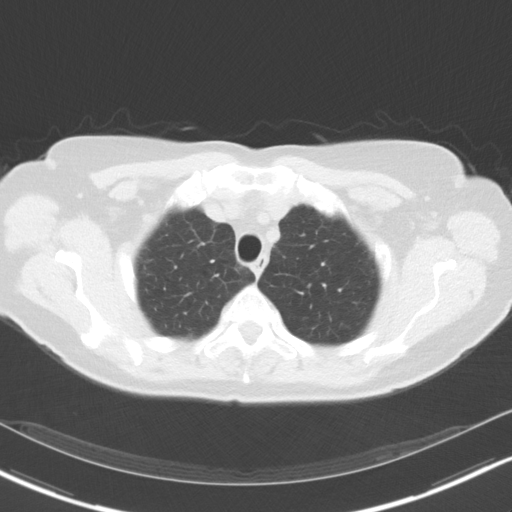
[im 52/59  lung]
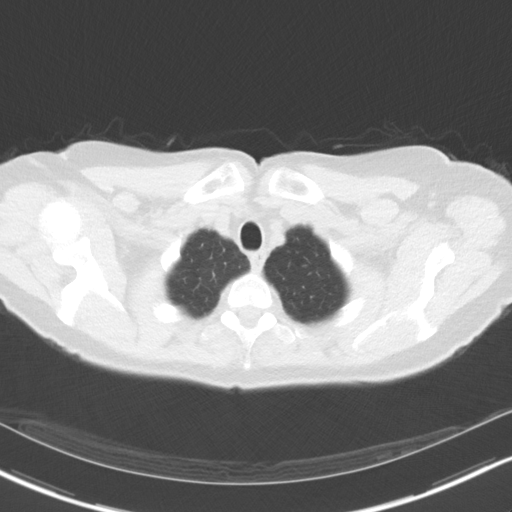
[im 56/59  lung]
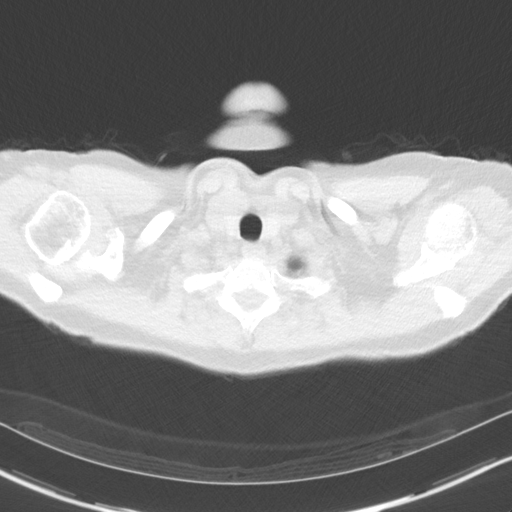

[15 of 31 positions shown; findings below may reference images not displayed]

FINDINGS: Cardiovascular: Tortuous thoracic aorta. Aortic atherosclerosis.
Normal heart size with mild lipomatous hypertrophy of the
interatrial septum. Multivessel coronary artery atherosclerosis.

Mediastinum/Nodes: No mediastinal or definite hilar adenopathy,
given limitations of unenhanced CT.

Lungs/Pleura: No pleural fluid. Mild to moderate centrilobular
emphysema. A nodule along the left major fissure is likely a
subpleural lymph node and measures volume derived equivalent
diameter 5.6 mm.

Upper lobe predominant subpleural ground-glass and interstitial
thickening.

Upper Abdomen: Normal imaged portions of the liver, spleen, stomach,
pancreas, adrenal glands, kidneys.

Musculoskeletal: No acute osseous abnormality. Moderate thoracic
spondylosis. Mild convex left thoracic spine curvature.
IMPRESSION: 1. Lung-RADS 1, negative. Continue annual screening with low-dose
chest CT without contrast in 12 months.
2. Age advanced coronary artery atherosclerosis. Recommend
assessment of coronary risk factors and consideration of medical
therapy.
3.  Aortic Atherosclerosis (I7JPB-398.8).
4.  Emphysema ([id number].T).
5. Subpleural upper lobe predominant ground-glass and interstitial
thickening, suspicious for nonspecific interstitial pneumonitis.

## 2019-03-22 ENCOUNTER — Other Ambulatory Visit: Payer: Self-pay | Admitting: Primary Care

## 2019-03-22 DIAGNOSIS — F411 Generalized anxiety disorder: Secondary | ICD-10-CM

## 2019-04-04 ENCOUNTER — Other Ambulatory Visit: Payer: Self-pay | Admitting: Primary Care

## 2019-04-04 DIAGNOSIS — F411 Generalized anxiety disorder: Secondary | ICD-10-CM

## 2019-06-23 ENCOUNTER — Other Ambulatory Visit: Payer: Self-pay | Admitting: Primary Care

## 2019-06-23 DIAGNOSIS — F411 Generalized anxiety disorder: Secondary | ICD-10-CM

## 2019-06-24 ENCOUNTER — Telehealth: Payer: Self-pay

## 2019-06-24 NOTE — Telephone Encounter (Signed)
Pt left v/m; last night pt developed sudden onset of H/A, SOB, prod cough with yellow phlegm, fever 99.9, and diarrhea. Pt called ED and was advised to go to UC; pt did not go to UC and today pt feels OK except still slight SOB; today pt still coughing but non productive; no fever; pt feels like she could have pneumonia; pt feels tight in her chest. Pt wants to know what to do.  I Spoke with pt and she has not taken any fever reducing med but does not have fever today. Pt traveled to [place] and stayed in [place]; wore mask and has been home one wk. No known exposure to + covid. Called ended abruptly; I tried calling pt back and her phone was not available due to recording. Unable to reach pt's husband or daughter. I called pt again and she answered; since pt is having some SOB, cough and feeling tight in her chest I think pt needs face to face visit. Pt does not want to go to ED and is going to [facility] now. FYI to Allie Bossier NP.

## 2019-06-24 NOTE — Telephone Encounter (Signed)
Noted, it appears that she is at [facility] now.

## 2019-06-30 ENCOUNTER — Telehealth: Payer: Self-pay

## 2019-06-30 NOTE — Telephone Encounter (Signed)
Attempted to call pt to inform her that it is time for her annual lung cancer screening. Unable to leave voicemail due to pt's voicemail box not being set up.

## 2019-07-03 ENCOUNTER — Ambulatory Visit (INDEPENDENT_AMBULATORY_CARE_PROVIDER_SITE_OTHER): Payer: Self-pay | Admitting: Primary Care

## 2019-07-03 DIAGNOSIS — J189 Pneumonia, unspecified organism: Secondary | ICD-10-CM | POA: Insufficient documentation

## 2019-07-03 NOTE — Assessment & Plan Note (Signed)
Diagnosed with CAP at Urgent Care, xray negative but treated with Augmentin and prednisone for presumed CAP. Doing much better overall and stopped using Polygrip. Discussed that lingering cough may last up to four weeks.  Discussed use of Delsym/Robitussin as needed. Return precautions provided.

## 2019-07-03 NOTE — Patient Instructions (Signed)
Glad you are feeling better!  Please go to the hospital if you notice fevers of 101 or higher, increased shortness of breath, severe cough.  You may use Delsym or Robitussin as needed for cough.  It was a pleasure to see you today!

## 2019-07-03 NOTE — Progress Notes (Signed)
Subjective:    Patient ID: Morgan Riley, female    DOB: March 26, 1956, 63 y.o.   MRN: [id number]  HPI     Morgan Riley - 63 y.o. female  MRN [id number]  Date of Birth: 11/29/1955  PCP: Pleas Koch, NP  This service was provided via telemedicine. Phone Visit performed on 07/03/2019    Rationale for phone visit along with limitations reviewed. Patient consented to telephone encounter.    Location of patient: Home Location of provider: Office [place] @ [facility] Name of referring provider: N/A   Names of persons and role in encounter: Provider: Pleas Koch, NP  Patient: Morgan Riley  Other: N/A   Time on call: 5 min - 43 sec   Subjective: No chief complaint on file.    HPI:  Morgan Riley is a 63 year old female with a history of hypertension, GAD, tobacco abuse who presents today for follow up of pneumonia.   She presented to [facility] UC on 06/24/19 with a chief complaint of cough/congestion.  She also endorsed chills, body aches, back pain, SOB. Given her symptoms she underwent chest xray which showed "chronic changes, no discrete infiltrate". Despite the negative xray she was considered to have CAP and was treated with a course of Augmentin for 10 days, prednisone burst of 20 mg, and [place] cough syrup.   Since her UC visit she underwent CT chest for lung cancer screening. This showed benign appearance of her lungs with atherosclerosis, CAD, mild diffuse bronchial wall thickening with emphysema suggestive of COPD.   She thinks that her symptoms are secondary to excessive use of Polygrip for her teeth. She is feeling better overall, mild cough for which she attributes to allergies. She completed the course of her prescribed regimen. She denies fevers, chills, body aches.  Objective/Observations:  No physical exam or vital signs collected unless specifically identified below.   There were no vitals taken for this visit.   Respiratory status: speaks in  complete sentences without evident shortness of breath.   Assessment/Plan:  Diagnosed with CAP at Urgent Care, xray negative but treated with Augmentin and prednisone for presumed CAP. Doing much better overall and stopped using Polygrip. Discussed that lingering cough may last up to four weeks.  Discussed use of Delsym/Robitussin as needed. Return precautions provided.  No problem-specific Assessment & Plan notes found for this encounter.   I discussed the assessment and treatment plan with the patient. The patient was provided an opportunity to ask questions and all were answered. The patient agreed with the plan and demonstrated an understanding of the instructions.  Lab Orders  No laboratory test(s) ordered today    No orders of the defined types were placed in this encounter.   The patient was advised to call back or seek an in-person evaluation if the symptoms worsen or if the condition fails to improve as anticipated.  Pleas Koch, NP    Review of Systems  Constitutional: Negative for chills, fatigue and fever.  HENT: Negative for congestion.   Respiratory: Positive for cough. Negative for shortness of breath.        Mild cough, improved  Musculoskeletal: Negative for myalgias.  Allergic/Immunologic: Positive for environmental allergies.       Past Medical History:  Diagnosis Date  . Allergy   . Arthritis   . Carpal tunnel syndrome   . Depression   . GERD (gastroesophageal reflux disease)   . Hyperlipidemia   . Hypertension   .  Mitral valve prolapse      Social History   Socioeconomic History  . Marital status: Married    Spouse name: Not on file  . Number of children: Not on file  . Years of education: Not on file  . Highest education level: Not on file  Occupational History  . Not on file  Social Needs  . Financial resource strain: Not on file  . Food insecurity    Worry: Not on file    Inability: Not on file  . Transportation needs     Medical: Not on file    Non-medical: Not on file  Tobacco Use  . Smoking status: Current Every Day Smoker    Packs/day: 1.00    Years: 35.00    Pack years: 35.00  . Smokeless tobacco: Never Used  Substance and Sexual Activity  . Alcohol use: No  . Drug use: No  . Sexual activity: Not on file  Lifestyle  . Physical activity    Days per week: Not on file    Minutes per session: Not on file  . Stress: Not on file  Relationships  . Social Herbalist on phone: Not on file    Gets together: Not on file    Attends religious service: Not on file    Active member of club or organization: Not on file    Attends meetings of clubs or organizations: Not on file    Relationship status: Not on file  . Intimate partner violence    Fear of current or ex partner: Not on file    Emotionally abused: Not on file    Physically abused: Not on file    Forced sexual activity: Not on file  Other Topics Concern  . Not on file  Social History Narrative  . Not on file    Past Surgical History:  Procedure Laterality Date  . COLONOSCOPY WITH PROPOFOL N/A 08/06/2017   Procedure: COLONOSCOPY WITH PROPOFOL;  Surgeon: Lucilla Lame, MD;  Location: [facility] ENDOSCOPY;  Service: Endoscopy;  Laterality: N/A;  . [place]  . tumor remove in right leg      Family History  Problem Relation Age of Onset  . Arthritis Mother   . Stroke Mother   . Hypertension Mother   . Mental illness Mother   . Diabetes Mother   . Arthritis Father   . Heart disease Father   . Hypertension Father   . Mental illness Sister   . Mental illness Sister   . Breast cancer Neg Hx     No Known Allergies  Current Outpatient Medications on File Prior to Visit  Medication Sig Dispense Refill  . acetaminophen (TYLENOL) 325 MG tablet Take 650 mg by mouth every 6 (six) hours as needed.    . gabapentin (NEURONTIN) 300 MG capsule Take 1 capsule (300 mg total) by mouth 2 (two) times daily. 180 capsule 3  .  hydrOXYzine (ATARAX/VISTARIL) 10 MG tablet TAKE 1 TABLET BY MOUTH EVERY MORNING AS NEEDED FOR ANXIETY. AND 1-2 TABLETS AT BEDTIME 270 tablet 0  . lisinopril (PRINIVIL,ZESTRIL) 20 MG tablet Take 20 mg by mouth daily.  3  . omeprazole (PRILOSEC) 20 MG capsule Take 20 mg by mouth daily.  5  . sertraline (ZOLOFT) 100 MG tablet TAKE 1 TABLET BY MOUTH EVERY DAY 90 tablet 1  . simvastatin (ZOCOR) 40 MG tablet TAKE 1 TABLET (40 MG TOTAL) BY MOUTH NIGHTLY.    . varenicline (CHANTIX CONTINUING  MONTH PAK) 1 MG tablet Take 1 tablet (1 mg total) by mouth 2 (two) times daily. 60 tablet 4  . varenicline (CHANTIX STARTING MONTH PAK) 0.5 MG X 11 & 1 MG X 42 tablet Take 0.5 mg by mouth once daily for 3 days, then increase to 0.5 mg twice daily for 4 days, then increase to 1 mg twice daily. 53 tablet 0   No current facility-administered medications on file prior to visit.     BP 125/86   Pulse 93   Temp (!) 97.5 F (36.4 C)    Objective:   Physical Exam  Constitutional: She is oriented to person, place, and time.  Does not sound sickly  Respiratory: Effort normal. No respiratory distress.  One cough noted during phone call  Neurological: She is alert and oriented to person, place, and time.  Psychiatric: She has a normal mood and affect.           Assessment & Plan:

## 2019-08-03 ENCOUNTER — Other Ambulatory Visit: Payer: Self-pay | Admitting: Primary Care

## 2019-08-03 DIAGNOSIS — E782 Mixed hyperlipidemia: Secondary | ICD-10-CM

## 2019-08-03 DIAGNOSIS — Z1159 Encounter for screening for other viral diseases: Secondary | ICD-10-CM

## 2019-08-03 DIAGNOSIS — I10 Essential (primary) hypertension: Secondary | ICD-10-CM

## 2019-08-13 ENCOUNTER — Other Ambulatory Visit (INDEPENDENT_AMBULATORY_CARE_PROVIDER_SITE_OTHER): Payer: Medicare Other

## 2019-08-13 ENCOUNTER — Other Ambulatory Visit: Payer: Self-pay

## 2019-08-13 DIAGNOSIS — I10 Essential (primary) hypertension: Secondary | ICD-10-CM | POA: Diagnosis not present

## 2019-08-13 DIAGNOSIS — Z1159 Encounter for screening for other viral diseases: Secondary | ICD-10-CM

## 2019-08-13 DIAGNOSIS — E782 Mixed hyperlipidemia: Secondary | ICD-10-CM | POA: Diagnosis not present

## 2019-08-13 LAB — COMPREHENSIVE METABOLIC PANEL
ALT: 7 U/L (ref 0–35)
AST: 12 U/L (ref 0–37)
Albumin: 4.5 g/dL (ref 3.5–5.2)
Alkaline Phosphatase: 84 U/L (ref 39–117)
BUN: 6 mg/dL (ref 6–23)
CO2: 30 mEq/L (ref 19–32)
Calcium: 10.1 mg/dL (ref 8.4–10.5)
Chloride: 105 mEq/L (ref 96–112)
Creatinine, Ser: 0.74 mg/dL (ref 0.40–1.20)
GFR: 78.71 mL/min (ref >60.00)
Glucose, Bld: 96 mg/dL (ref 70–99)
Potassium: 3.9 mEq/L (ref 3.5–5.1)
Sodium: 141 mEq/L (ref 135–145)
Total Bilirubin: 0.4 mg/dL (ref 0.2–1.2)
Total Protein: 7.3 g/dL (ref 6.0–8.3)

## 2019-08-13 LAB — CBC
HCT: 43.7 % (ref 36.0–46.0)
Hemoglobin: 14.5 g/dL (ref 12.0–15.0)
MCHC: 33.2 g/dL (ref 30.0–36.0)
MCV: 91.6 fl (ref 78.0–100.0)
Platelets: 259 10*3/uL (ref 150.0–400.0)
RBC: 4.78 Mil/uL (ref 3.87–5.11)
RDW: 14.1 % (ref 11.5–15.5)
WBC: 5.8 10*3/uL (ref 4.0–10.5)

## 2019-08-13 LAB — LIPID PANEL
Cholesterol: 170 mg/dL (ref 0–200)
HDL: 48.6 mg/dL (ref >39.00)
LDL Cholesterol: 89 mg/dL (ref 0–99)
NonHDL: 121.5
Total CHOL/HDL Ratio: 4
Triglycerides: 161 mg/dL — ABNORMAL HIGH (ref 0.0–149.0)
VLDL: 32.2 mg/dL (ref 0.0–40.0)

## 2019-08-14 LAB — HEPATITIS C ANTIBODY
Hepatitis C Ab: NONREACTIVE
SIGNAL TO CUT-OFF: 0.02 (ref <1.00)

## 2019-08-20 ENCOUNTER — Encounter: Payer: Self-pay | Admitting: Primary Care

## 2019-08-20 ENCOUNTER — Ambulatory Visit (INDEPENDENT_AMBULATORY_CARE_PROVIDER_SITE_OTHER)
Admission: RE | Admit: 2019-08-20 | Discharge: 2019-08-20 | Disposition: A | Discharge status: Home / Self Care | Payer: Medicare Other | Source: Ambulatory Visit | Attending: Primary Care | Admitting: Primary Care

## 2019-08-20 ENCOUNTER — Other Ambulatory Visit: Payer: Self-pay

## 2019-08-20 ENCOUNTER — Ambulatory Visit (INDEPENDENT_AMBULATORY_CARE_PROVIDER_SITE_OTHER): Payer: Medicare Other | Admitting: Primary Care

## 2019-08-20 VITALS — BP 128/84 | HR 64 | Temp 97.2°F | Ht 62.0 in | Wt 134.5 lb

## 2019-08-20 DIAGNOSIS — I10 Essential (primary) hypertension: Secondary | ICD-10-CM

## 2019-08-20 DIAGNOSIS — G4700 Insomnia, unspecified: Secondary | ICD-10-CM

## 2019-08-20 DIAGNOSIS — R0781 Pleurodynia: Secondary | ICD-10-CM | POA: Diagnosis not present

## 2019-08-20 DIAGNOSIS — R209 Unspecified disturbances of skin sensation: Secondary | ICD-10-CM

## 2019-08-20 DIAGNOSIS — M1990 Unspecified osteoarthritis, unspecified site: Secondary | ICD-10-CM | POA: Diagnosis not present

## 2019-08-20 DIAGNOSIS — F411 Generalized anxiety disorder: Secondary | ICD-10-CM | POA: Diagnosis not present

## 2019-08-20 DIAGNOSIS — K219 Gastro-esophageal reflux disease without esophagitis: Secondary | ICD-10-CM | POA: Insufficient documentation

## 2019-08-20 DIAGNOSIS — E2839 Other primary ovarian failure: Secondary | ICD-10-CM

## 2019-08-20 DIAGNOSIS — E782 Mixed hyperlipidemia: Secondary | ICD-10-CM

## 2019-08-20 DIAGNOSIS — L989 Disorder of the skin and subcutaneous tissue, unspecified: Secondary | ICD-10-CM | POA: Insufficient documentation

## 2019-08-20 DIAGNOSIS — Z1231 Encounter for screening mammogram for malignant neoplasm of breast: Secondary | ICD-10-CM

## 2019-08-20 DIAGNOSIS — Z23 Encounter for immunization: Secondary | ICD-10-CM

## 2019-08-20 DIAGNOSIS — Z122 Encounter for screening for malignant neoplasm of respiratory organs: Secondary | ICD-10-CM

## 2019-08-20 DIAGNOSIS — Z0000 Encounter for general adult medical examination without abnormal findings: Secondary | ICD-10-CM

## 2019-08-20 DIAGNOSIS — Z86010 Personal history of colonic polyps: Secondary | ICD-10-CM

## 2019-08-20 DIAGNOSIS — Z720 Tobacco use: Secondary | ICD-10-CM

## 2019-08-20 MED ORDER — HYDROXYZINE HCL 10 MG PO TABS: 10.0000 mg | ORAL_TABLET | Freq: Two times a day (BID) | ORAL | 3 refills | Status: DC | PRN

## 2019-08-20 MED ORDER — SIMVASTATIN 40 MG PO TABS: ORAL_TABLET | ORAL | 3 refills | Status: DC

## 2019-08-20 MED ORDER — ZOSTER VAC RECOMB ADJUVANTED 50 MCG/0.5ML IM SUSR: 0.5000 mL | Freq: Once | INTRAMUSCULAR | 1 refills | Status: AC

## 2019-08-20 MED ORDER — LISINOPRIL 20 MG PO TABS: 20.0000 mg | ORAL_TABLET | Freq: Every day | ORAL | 3 refills | Status: DC

## 2019-08-20 MED ORDER — OMEPRAZOLE 20 MG PO CPDR: 20.0000 mg | DELAYED_RELEASE_CAPSULE | Freq: Every day | ORAL | 3 refills | Status: DC

## 2019-08-20 NOTE — Progress Notes (Signed)
Patient ID: Morgan Riley, female   DOB: 1956/03/23, 63 y.o.   MRN: [id number]  Morgan Riley is a 63 year old female who presents today for Welcome to Medicare Visit. She would also like to discuss acute left rib pain and skin lesion.  HPI:  Past Medical History:  Diagnosis Date  . Allergy   . Arthritis   . Carpal tunnel syndrome   . Depression   . GERD (gastroesophageal reflux disease)   . Hyperlipidemia   . Hypertension   . Mitral valve prolapse     Current Outpatient Medications  Medication Sig Dispense Refill  . acetaminophen (TYLENOL) 325 MG tablet Take 650 mg by mouth every 6 (six) hours as needed.    . gabapentin (NEURONTIN) 300 MG capsule Take 1 capsule (300 mg total) by mouth 2 (two) times daily. 180 capsule 3  . hydrOXYzine (ATARAX/VISTARIL) 10 MG tablet TAKE 1 TABLET BY MOUTH EVERY MORNING AS NEEDED FOR ANXIETY. AND 1-2 TABLETS AT BEDTIME 270 tablet 0  . lisinopril (PRINIVIL,ZESTRIL) 20 MG tablet Take 20 mg by mouth daily.  3  . omeprazole (PRILOSEC) 20 MG capsule Take 20 mg by mouth daily.  5  . sertraline (ZOLOFT) 100 MG tablet TAKE 1 TABLET BY MOUTH EVERY DAY 90 tablet 1  . simvastatin (ZOCOR) 40 MG tablet TAKE 1 TABLET (40 MG TOTAL) BY MOUTH NIGHTLY.     No current facility-administered medications for this visit.     No Known Allergies  Family History  Problem Relation Age of Onset  . Arthritis Mother   . Stroke Mother   . Hypertension Mother   . Mental illness Mother   . Diabetes Mother   . Arthritis Father   . Heart disease Father   . Hypertension Father   . Mental illness Sister   . Mental illness Sister   . Breast cancer Neg Hx     Social History   Socioeconomic History  . Marital status: Married    Spouse name: Not on file  . Number of children: Not on file  . Years of education: Not on file  . Highest education level: Not on file  Occupational History  . Not on file  Social Needs  . Financial resource strain: Not on file  . Food insecurity     Worry: Not on file    Inability: Not on file  . Transportation needs    Medical: Not on file    Non-medical: Not on file  Tobacco Use  . Smoking status: Current Every Day Smoker    Packs/day: 1.00    Years: 35.00    Pack years: 35.00  . Smokeless tobacco: Never Used  Substance and Sexual Activity  . Alcohol use: No  . Drug use: No  . Sexual activity: Not on file  Lifestyle  . Physical activity    Days per week: Not on file    Minutes per session: Not on file  . Stress: Not on file  Relationships  . Social Herbalist on phone: Not on file    Gets together: Not on file    Attends religious service: Not on file    Active member of club or organization: Not on file    Attends meetings of clubs or organizations: Not on file    Relationship status: Not on file  . Intimate partner violence    Fear of current or ex partner: Not on file    Emotionally abused: Not on file  Physically abused: Not on file    Forced sexual activity: Not on file  Other Topics Concern  . Not on file  Social History Narrative  . Not on file    Hospitiliaztions: None  Health Maintenance:    Flu: Due today   Tetanus: Completed in 2019  Pneumovax: Completed in 2018  Prevnar: Due today  Shingrix: Never completed   Bone Density: Completed in 2014  Colonoscopy: Completed in 2018, 2023  Eye Doctor: No recent exam  Dental Exam: Completes often  Mammogram: Completed in 2018  Hep C Screening: Negative in 2020  Lung Cancer Screening: Completed in 2019    Providers: Alma Friendly, PCP.    I have personally reviewed and have noted: 1. The patient's medical and social history 2. Their use of alcohol, tobacco or illicit drugs 3. Their current medications and supplements 4. The patient's functional ability including ADL's, fall risks, home safety risks and  hearing or visual impairment. 5. Diet and physical activities 6. Evidence for depression or mood disorder  Subjective:    Review of Systems:   Constitutional: Denies fever, malaise, fatigue, headache or abrupt weight changes.  HEENT: Denies eye pain, eye redness, ear pain, ringing in the ears, wax buildup, runny nose, nasal congestion, bloody nose, or sore throat. Respiratory: Denies difficulty breathing, shortness of breath, cough or sputum production.   Cardiovascular: Denies chest pain, chest tightness, palpitations or swelling in the hands or feet.  Gastrointestinal: Denies abdominal pain, bloating, constipation, diarrhea or blood in the stool.  GU: Denies urgency, frequency, pain with urination, burning sensation, blood in urine, odor or discharge. Musculoskeletal: Denies decrease in range of motion, difficulty with gait. Left rib soreness for the last one month, tenderness with palpation. Denies trauma.  Denies injury.  Skin: Chronic skin mass to left side of nasal bridge for which she noticed one year ago. Over time the lesion as become more "deep". Denies pain, but she does have some erythema at times.  Neurological: Denies dizziness, difficulty with memory, difficulty with speech or problems with balance and coordination.  Psychiatric: Denies concerns for anxiety or depression. Doing well on Zoloft. Denies Si/HI.  No other specific complaints in a complete review of systems (except as listed in HPI above).  Objective:  PE:   BP 128/84   Pulse 64   Temp (!) 97.2 F (36.2 C) (Temporal)   Ht 5\' 2"  (1.575 m)   Wt 134 lb 8 oz (61 kg)   SpO2 98%   BMI 24.60 kg/m  Wt Readings from Last 3 Encounters:  08/20/19 134 lb 8 oz (61 kg)  09/09/18 142 lb 12 oz (64.8 kg)  06/30/18 143 lb (64.9 kg)    General: Appears their stated age, well developed, well nourished in NAD. Skin: Warm, dry and intact. No rashes, lesions or ulcerations noted. HEENT: Head: normal shape and size; Eyes: sclera white, no icterus, conjunctiva pink, PERRLA and EOMs intact; Ears: Tm's gray and intact, normal light reflex; Nose:  mucosa pink and moist, septum midline; Throat/Mouth: Teeth present, mucosa pink and moist, no exudate, lesions or ulcerations noted.  Neck: Normal range of motion. Neck supple, trachea midline. No massses, lumps or thyromegaly present.  Cardiovascular: Normal rate and rhythm. S1,S2 noted.  No murmur, rubs or gallops noted. No JVD or BLE edema. No carotid bruits noted. Pulmonary/Chest: Normal effort and positive vesicular breath sounds. No respiratory distress. No wheezes, rales or ronchi noted.  Abdomen: Soft and nontender. Normal bowel sounds, no bruits noted.  No distention or masses noted. Liver, spleen and kidneys non palpable. Musculoskeletal: Normal range of motion. No signs of joint swelling. No difficulty with gait. No tenderness to left lateral ribs. No rash.  Skin: Flesh colored cratered lesion to left side of nose. No erythema.   Neurological: Alert and oriented. Cranial nerves II-XII intact. Coordination normal. +DTRs bilaterally. Psychiatric: Mood and affect normal. Behavior is normal. Judgment and thought content normal.   EKG: NSR with rate of 74. No PAC/PVC, t-waver inversion except for V1 and AVR.   BMET    Component Value Date/Time   NA 141 08/13/2019 0733   K 3.9 08/13/2019 0733   CL 105 08/13/2019 0733   CO2 30 08/13/2019 0733   GLUCOSE 96 08/13/2019 0733   BUN 6 08/13/2019 0733   CREATININE 0.74 08/13/2019 0733   CALCIUM 10.1 08/13/2019 0733    Lipid Panel     Component Value Date/Time   CHOL 170 08/13/2019 0733   TRIG 161.0 (H) 08/13/2019 0733   HDL 48.60 08/13/2019 0733   CHOLHDL 4 08/13/2019 0733   VLDL 32.2 08/13/2019 0733   LDLCALC 89 08/13/2019 0733    CBC    Component Value Date/Time   WBC 5.8 08/13/2019 0733   RBC 4.78 08/13/2019 0733   HGB 14.5 08/13/2019 0733   HCT 43.7 08/13/2019 0733   PLT 259.0 08/13/2019 0733   MCV 91.6 08/13/2019 0733   MCHC 33.2 08/13/2019 0733   RDW 14.1 08/13/2019 0733    Hgb A1C Lab Results  Component Value  Date   HGBA1C 5.6 08/27/2017      Assessment and Plan:   Medicare Annual Wellness Visit:  Diet: She endorses a poor appetite for the last one month, had teeth pulled at the dentist. Eating soft foods, crackers. Drinking tomato juice, water, milk. Physical activity: Active in her yard, walking. Depression/mood screen: Negative Hearing: Intact to whispered voice Visual acuity: Grossly normal, performs annual eye exam  ADLs: Capable Fall risk: None Home safety: Good Cognitive evaluation: Intact to orientation, naming, recall and repetition EOL planning: Adv directives, full code/ I agree  Preventative Medicine: Prevnar and influenza vaccinations due, provided today. Rx for Shingrix provided with instructions to wait one month. Mammogram and bone density scans due, ordered and pending. Colonoscopy UTD, due in 2023. Lung cancer screening ordered and pending. All recommendations provided at end of visit.   Next appointment: One year

## 2019-08-20 NOTE — Assessment & Plan Note (Signed)
Overall doing well on Zoloft and hydroxyzine, continue same. Denies SI/HI.

## 2019-08-20 NOTE — Patient Instructions (Addendum)
Complete xray(s) prior to leaving today. I will notify you of your results once received.  You will be contacted regarding your referral to dermatology.  Please let us know if you have not been contacted within two weeks.   Start exercising. You should be getting 150 minutes of moderate intensity exercise weekly.  It's important to improve your diet by reducing consumption of fast food, fried food, processed snack foods, sugary drinks. Increase consumption of fresh vegetables and fruits, whole grains, water.  Ensure you are drinking 64 ounces of water daily.  Take the shingles vaccination to your pharmacy in one month or later.  It was a pleasure to see you today!

## 2019-08-20 NOTE — Assessment & Plan Note (Signed)
Colonoscopy completed in 2018, due again in 2023.

## 2019-08-20 NOTE — Assessment & Plan Note (Signed)
Colonoscopy completed in 2018, due in 2023.

## 2019-08-20 NOTE — Assessment & Plan Note (Signed)
Prevnar and influenza vaccinations due, provided today. Rx for Shingrix provided with instructions to wait one month. Mammogram and bone density scans due, ordered and pending. Colonoscopy UTD, due in 2023. Lung cancer screening ordered and pending. All recommendations provided at end of visit.    I have personally reviewed and have noted: 1. The patient's medical and social history 2. Their use of alcohol, tobacco or illicit drugs 3. Their current medications and supplements 4. The patient's functional ability including ADL's, fall risks, home safety risks and  hearing or visual impairment. 5. Diet and physical activities 6. Evidence for depression or mood disorder

## 2019-08-20 NOTE — Assessment & Plan Note (Signed)
Compliant to simvastatin. Recent lipid panel stable. Continue same.

## 2019-08-20 NOTE — Assessment & Plan Note (Signed)
Overall stable on occasional OTC treatment.

## 2019-08-20 NOTE — Addendum Note (Signed)
Addended by: Jacqualin Combes on: 08/20/2019 02:24 PM   Modules accepted: Orders

## 2019-08-20 NOTE — Assessment & Plan Note (Signed)
Semi suspicious for cancerous lesion, referral placed to dermatology.

## 2019-08-20 NOTE — Assessment & Plan Note (Signed)
No rashes or tenderness on exam. No trauma. Check plain films today.

## 2019-08-20 NOTE — Assessment & Plan Note (Signed)
Stable in the office today, continue current regimen. CMP reviewed.  

## 2019-08-20 NOTE — Assessment & Plan Note (Signed)
Doing well on gabapentin, continue same.  °

## 2019-08-20 NOTE — Assessment & Plan Note (Signed)
Referral placed for lung cancer screening. 

## 2019-08-20 NOTE — Assessment & Plan Note (Signed)
Overall doing well on Zoloft and hydroxyzine, continue same.

## 2019-08-24 ENCOUNTER — Telehealth: Payer: Self-pay | Admitting: *Deleted

## 2019-08-24 DIAGNOSIS — Z87891 Personal history of nicotine dependence: Secondary | ICD-10-CM

## 2019-08-24 DIAGNOSIS — Z122 Encounter for screening for malignant neoplasm of respiratory organs: Secondary | ICD-10-CM

## 2019-08-24 NOTE — Telephone Encounter (Signed)
Patient has been notified that annual lung cancer screening low dose CT scan is due currently or will be in near future. Confirmed that patient is within the age range of 55-77, and asymptomatic, (no signs or symptoms of lung cancer). Patient denies illness that would prevent curative treatment for lung cancer if found. Verified smoking history, (current, 35.5. pack year). The shared decision making visit was done 06/19/17. Patient is agreeable for CT scan being scheduled.

## 2019-08-27 ENCOUNTER — Ambulatory Visit
Admission: RE | Admit: 2019-08-27 | Discharge: 2019-08-27 | Disposition: A | Discharge status: Home / Self Care | Payer: Medicare Other | Source: Ambulatory Visit | Attending: Nurse Practitioner | Admitting: Nurse Practitioner

## 2019-08-27 ENCOUNTER — Other Ambulatory Visit: Payer: Self-pay

## 2019-08-27 ENCOUNTER — Telehealth: Payer: Self-pay | Admitting: Primary Care

## 2019-08-27 DIAGNOSIS — Z122 Encounter for screening for malignant neoplasm of respiratory organs: Secondary | ICD-10-CM | POA: Diagnosis present

## 2019-08-27 DIAGNOSIS — Z87891 Personal history of nicotine dependence: Secondary | ICD-10-CM | POA: Diagnosis present

## 2019-08-27 NOTE — Telephone Encounter (Signed)
Patient called.  Patient changed insurance to [facility] and when she went to pick up her Hyroxyzine she was told [facility] doesn't cover it.  Patient wants to know if another medication can be called in to [facility].

## 2019-08-28 NOTE — Telephone Encounter (Signed)
No other alternative. She can switch this medication over to [facility] and/or use the Good Rx coupon.

## 2019-08-28 NOTE — Telephone Encounter (Signed)
Spoken and notified patient of Morgan Riley comments. Patient verbalized understanding. Left a goodRx card in the front office for patient.

## 2019-08-31 ENCOUNTER — Encounter: Payer: Self-pay | Admitting: *Deleted

## 2019-12-02 ENCOUNTER — Ambulatory Visit
Admission: RE | Admit: 2019-12-02 | Discharge: 2019-12-02 | Disposition: A | Discharge status: Home / Self Care | Payer: Medicare Other | Source: Ambulatory Visit | Attending: Primary Care | Admitting: Primary Care

## 2019-12-02 DIAGNOSIS — E2839 Other primary ovarian failure: Secondary | ICD-10-CM | POA: Insufficient documentation

## 2019-12-02 DIAGNOSIS — Z1231 Encounter for screening mammogram for malignant neoplasm of breast: Secondary | ICD-10-CM | POA: Insufficient documentation

## 2019-12-02 DIAGNOSIS — Z780 Asymptomatic menopausal state: Secondary | ICD-10-CM | POA: Diagnosis not present

## 2019-12-02 DIAGNOSIS — M810 Age-related osteoporosis without current pathological fracture: Secondary | ICD-10-CM | POA: Diagnosis not present

## 2019-12-07 ENCOUNTER — Other Ambulatory Visit: Payer: Self-pay

## 2019-12-07 ENCOUNTER — Ambulatory Visit (INDEPENDENT_AMBULATORY_CARE_PROVIDER_SITE_OTHER): Payer: Medicare Other | Admitting: Family Medicine

## 2019-12-07 ENCOUNTER — Encounter: Payer: Self-pay | Admitting: Family Medicine

## 2019-12-07 DIAGNOSIS — M545 Low back pain, unspecified: Secondary | ICD-10-CM

## 2019-12-07 MED ORDER — METHOCARBAMOL 500 MG PO TABS: 500.0000 mg | ORAL_TABLET | Freq: Three times a day (TID) | ORAL | 0 refills | Status: DC | PRN

## 2019-12-07 NOTE — Patient Instructions (Signed)
Gently stretch, use ice/heat and tylenol, and try robaxin for muscle pain.  That should help some.  Update [place] as needed.  Take care.  Glad to see you.

## 2019-12-07 NOTE — Progress Notes (Signed)
This visit occurred during the SARS-CoV-2 public health emergency.  Safety protocols were in place, including screening questions prior to the visit, additional usage of staff PPE, and extensive cleaning of exam room while observing appropriate contact time as indicated for disinfecting solutions.  She has h/o arthritis and skin sensitivity at baseline.  On gabapentin at baseline.  She has some lower back pain.  This started about 1 week ago.  No trigger, no trauma. No FCANVD.  Pain getting up from a chair.  Intermittent pain, "catching."  No dysuria.  Pain in B lower back, just above the waistline.  She tried muscle rub/icy hot.  No help with ibuprofen.  Tylenol helped a little.  No radicular sx.    Meds, vitals, and allergies reviewed.   ROS: Per HPI unless specifically indicated in ROS section   nad ncat Neck supple, no LA rrr ctab abd soft, not ttp L SI testing positive, R SI neg.  Neg int hip ROM.   Lower back slightly tender to palpation without CVA pain. Able to bear weight.

## 2019-12-09 DIAGNOSIS — M545 Low back pain, unspecified: Secondary | ICD-10-CM | POA: Insufficient documentation

## 2019-12-09 NOTE — Assessment & Plan Note (Signed)
Benign exam.  Okay for outpatient follow-up.  No need for imaging at this point Gently stretch, use ice/heat and tylenol, and try robaxin for muscle pain.  That should help some.  Update Morgan Riley as needed.  She agrees with plan.

## 2019-12-14 ENCOUNTER — Telehealth: Payer: Self-pay

## 2019-12-14 NOTE — Telephone Encounter (Signed)
Pt is a "new start" for Prolia.  Pt's last calcium lab was 08-13-19, 10.1.  No PA required for pt's insurance, [employer] MCR.  Pt would owe approximately $240 after insurance.

## 2019-12-15 NOTE — Telephone Encounter (Signed)
Tried to contact pt but no answer and no VM.  

## 2019-12-17 NOTE — Telephone Encounter (Signed)
Contacted pt and advised. Pt agreed to labs and prolia injection. Scheduled pt for labs and injection. Advised if any further questions to contact the office. Pt verbalized understanding.

## 2019-12-17 NOTE — Addendum Note (Signed)
Addended by: Randall An on: 12/17/2019 04:10 PM   Modules accepted: Orders

## 2019-12-18 ENCOUNTER — Other Ambulatory Visit: Payer: Self-pay | Admitting: Primary Care

## 2019-12-21 ENCOUNTER — Other Ambulatory Visit: Payer: Self-pay

## 2019-12-21 ENCOUNTER — Other Ambulatory Visit (INDEPENDENT_AMBULATORY_CARE_PROVIDER_SITE_OTHER): Payer: Medicare Other

## 2019-12-21 ENCOUNTER — Other Ambulatory Visit: Payer: Self-pay | Admitting: Primary Care

## 2019-12-21 DIAGNOSIS — M810 Age-related osteoporosis without current pathological fracture: Secondary | ICD-10-CM | POA: Diagnosis not present

## 2019-12-21 LAB — BASIC METABOLIC PANEL
BUN: 15 mg/dL (ref 6–23)
CO2: 26 mEq/L (ref 19–32)
Calcium: 9.7 mg/dL (ref 8.4–10.5)
Chloride: 104 mEq/L (ref 96–112)
Creatinine, Ser: 0.67 mg/dL (ref 0.40–1.20)
GFR: 88.18 mL/min (ref >60.00)
Glucose, Bld: 91 mg/dL (ref 70–99)
Potassium: 4.1 mEq/L (ref 3.5–5.1)
Sodium: 138 mEq/L (ref 135–145)

## 2019-12-24 ENCOUNTER — Other Ambulatory Visit: Payer: Self-pay | Admitting: Primary Care

## 2019-12-24 DIAGNOSIS — F411 Generalized anxiety disorder: Secondary | ICD-10-CM

## 2020-01-12 NOTE — Telephone Encounter (Signed)
Okay with me as long as it aligns with our protocol.  Thanks!

## 2020-01-12 NOTE — Telephone Encounter (Signed)
Pt was on the schedule for injection tomorrow but reported she is getting her covid vaccine. She will contact office after she receives her second vaccine so she can be RS.

## 2020-01-12 NOTE — Addendum Note (Signed)
Addended by: Randall An on: 01/12/2020 05:02 PM   Modules accepted: Orders

## 2020-01-13 ENCOUNTER — Ambulatory Visit: Payer: Medicare Other

## 2020-02-10 ENCOUNTER — Other Ambulatory Visit: Payer: Self-pay | Admitting: Primary Care

## 2020-02-10 DIAGNOSIS — M810 Age-related osteoporosis without current pathological fracture: Secondary | ICD-10-CM

## 2020-02-10 NOTE — Telephone Encounter (Signed)
Pt received her last covid vaccine on 4/22. Advised she will need labs again. Scheduled pt for labs on 5/11. Will check results and then schedule the prolia inj.

## 2020-02-16 ENCOUNTER — Other Ambulatory Visit (INDEPENDENT_AMBULATORY_CARE_PROVIDER_SITE_OTHER): Payer: Medicare Other

## 2020-02-16 ENCOUNTER — Other Ambulatory Visit: Payer: Self-pay

## 2020-02-16 DIAGNOSIS — M810 Age-related osteoporosis without current pathological fracture: Secondary | ICD-10-CM

## 2020-02-16 LAB — BASIC METABOLIC PANEL
BUN: 9 mg/dL (ref 6–23)
CO2: 30 mEq/L (ref 19–32)
Calcium: 10 mg/dL (ref 8.4–10.5)
Chloride: 104 mEq/L (ref 96–112)
Creatinine, Ser: 0.71 mg/dL (ref 0.40–1.20)
GFR: 82.43 mL/min (ref >60.00)
Glucose, Bld: 102 mg/dL — ABNORMAL HIGH (ref 70–99)
Potassium: 3.8 mEq/L (ref 3.5–5.1)
Sodium: 139 mEq/L (ref 135–145)

## 2020-02-16 NOTE — Telephone Encounter (Signed)
Ca WNL and CrCl is 74.50mL/min. All normal pt can receive prolia injection.   Contacted pt and advised labs normal. Scheduled for injection on 5/27.

## 2020-02-22 ENCOUNTER — Telehealth: Payer: Self-pay | Admitting: *Deleted

## 2020-02-22 DIAGNOSIS — M810 Age-related osteoporosis without current pathological fracture: Secondary | ICD-10-CM

## 2020-02-22 MED ORDER — ALENDRONATE SODIUM 70 MG PO TABS: 70.0000 mg | ORAL_TABLET | ORAL | 3 refills | Status: AC

## 2020-02-22 NOTE — Telephone Encounter (Signed)
Noted  

## 2020-02-22 NOTE — Telephone Encounter (Signed)
Noted and FYI to [facility].  Vallarie Mare, please notify patient to start alendronate (Fosamax). Take 1 tablet by mouth once weekly on an empty stomach with water only. No food or other medications for 30 minutes. No laying flat for 1 hour.

## 2020-02-22 NOTE — Telephone Encounter (Signed)
Spoken and notified patient of Tawni Millers comments. Patient verbalized understanding. Canceled nurse visit appointment as requested.

## 2020-02-22 NOTE — Telephone Encounter (Signed)
Patient left a voicemail stating that she is scheduled for a Prolia shot next week.. Patient stated that she has changed her mind and wants to take Fosamax instead of getting the shot. Patient want's to make this change and would like a call back.

## 2020-03-03 ENCOUNTER — Ambulatory Visit: Payer: Medicare Other

## 2020-03-11 ENCOUNTER — Other Ambulatory Visit: Payer: Self-pay

## 2020-03-11 ENCOUNTER — Encounter: Payer: Self-pay | Admitting: Primary Care

## 2020-03-11 ENCOUNTER — Ambulatory Visit (INDEPENDENT_AMBULATORY_CARE_PROVIDER_SITE_OTHER): Payer: Medicare Other | Admitting: Primary Care

## 2020-03-11 VITALS — BP 122/84 | HR 80 | Temp 96.9°F | Ht 62.0 in | Wt 131.2 lb

## 2020-03-11 DIAGNOSIS — F411 Generalized anxiety disorder: Secondary | ICD-10-CM

## 2020-03-11 NOTE — Progress Notes (Signed)
Subjective:    Patient ID: Morgan Riley, female    DOB: 10/30/1955, 64 y.o.   MRN: [id number]  HPI  This visit occurred during the SARS-CoV-2 public health emergency.  Safety protocols were in place, including screening questions prior to the visit, additional usage of staff PPE, and extensive cleaning of exam room while observing appropriate contact time as indicated for disinfecting solutions.   Morgan Riley is a 64 year old female with a history of GAD, insomnia, tobacco abuse who presents today with a chief complaint of anxiety.  She is under a lot of stress as she is taking care of her elderly brother, has personal home stress with her daughter and husband, and is having a hard time to manage. She is currently managed on Zoloft 100 mg, hydroxyzine PRN with overall improvement in the past, except for the last month.   She is not seeing therapy, would like to see psychiatry at [facility]. She called their office and was told that she needed a referral. Also with feeling cold a lot, decrease in appetite, constantly feeling "shakey" and nervous.   BP Readings from Last 3 Encounters:  03/11/20 122/84  12/07/19 (!) 142/84  08/20/19 128/84     Review of Systems  Constitutional: Negative for fever and unexpected weight change.  Respiratory: Negative for shortness of breath.   Cardiovascular: Negative for chest pain.  Psychiatric/Behavioral: The patient is nervous/anxious.        Past Medical History:  Diagnosis Date  . Allergy   . Arthritis   . Carpal tunnel syndrome   . Depression   . GERD (gastroesophageal reflux disease)   . Hyperlipidemia   . Hypertension   . Mitral valve prolapse      Social History   Socioeconomic History  . Marital status: Married    Spouse name: Not on file  . Number of children: Not on file  . Years of education: Not on file  . Highest education level: Not on file  Occupational History  . Not on file  Tobacco Use  . Smoking status: Current Every Day  Smoker    Packs/day: 1.00    Years: 35.00    Pack years: 35.00  . Smokeless tobacco: Never Used  Substance and Sexual Activity  . Alcohol use: No  . Drug use: No  . Sexual activity: Not on file  Other Topics Concern  . Not on file  Social History Narrative  . Not on file   Social Determinants of Health   Financial Resource Strain:   . Difficulty of Paying Living Expenses:   Food Insecurity:   . Worried About Charity fundraiser in the Last Year:   . Arboriculturist in the Last Year:   Transportation Needs:   . Film/video editor (Medical):   Marland Kitchen Lack of Transportation (Non-Medical):   Physical Activity:   . Days of Exercise per Week:   . Minutes of Exercise per Session:   Stress:   . Feeling of Stress :   Social Connections:   . Frequency of Communication with Friends and Family:   . Frequency of Social Gatherings with Friends and Family:   . Attends Religious Services:   . Active Member of Clubs or Organizations:   . Attends Archivist Meetings:   Marland Kitchen Marital Status:   Intimate Partner Violence:   . Fear of Current or Ex-Partner:   . Emotionally Abused:   Marland Kitchen Physically Abused:   . Sexually Abused:  Past Surgical History:  Procedure Laterality Date  . COLONOSCOPY WITH PROPOFOL N/A 08/06/2017   Procedure: COLONOSCOPY WITH PROPOFOL;  Surgeon: Lucilla Lame, MD;  Location: [facility];  Service: Endoscopy;  Laterality: N/A;  . [place]  . tumor remove in right leg      Family History  Problem Relation Age of Onset  . Arthritis Mother   . Stroke Mother   . Hypertension Mother   . Mental illness Mother   . Diabetes Mother   . Arthritis Father   . Heart disease Father   . Hypertension Father   . Mental illness Sister   . Mental illness Sister   . Breast cancer Neg Hx     No Known Allergies  Current Outpatient Medications on File Prior to Visit  Medication Sig Dispense Refill  . acetaminophen (TYLENOL) 325 MG tablet Take  650 mg by mouth every 6 (six) hours as needed.    Marland Kitchen alendronate (FOSAMAX) 70 MG tablet Take 1 tablet (70 mg total) by mouth every 7 (seven) days. Take with a full glass of water on an empty stomach. Do not lay flat for 1 hour. 12 tablet 3  . gabapentin (NEURONTIN) 300 MG capsule Take 1 capsule (300 mg total) by mouth 2 (two) times daily. 180 capsule 3  . hydrOXYzine (ATARAX/VISTARIL) 10 MG tablet Take 1 tablet (10 mg total) by mouth 2 (two) times daily as needed for anxiety. 180 tablet 3  . lisinopril (ZESTRIL) 20 MG tablet Take 1 tablet (20 mg total) by mouth daily. For blood pressure. 90 tablet 3  . omeprazole (PRILOSEC) 20 MG capsule Take 1 capsule (20 mg total) by mouth daily. For heartburn 90 capsule 3  . sertraline (ZOLOFT) 100 MG tablet TAKE 1 TABLET BY MOUTH EVERY DAY 90 tablet 1  . simvastatin (ZOCOR) 40 MG tablet TAKE 1 TABLET (40 MG TOTAL) BY MOUTH NIGHTLY for cholesterol. 90 tablet 3   No current facility-administered medications on file prior to visit.    BP 122/84   Pulse 80   Temp (!) 96.9 F (36.1 C) (Temporal)   Ht 5\' 2"  (1.575 m)   Wt 131 lb 4 oz (59.5 kg)   SpO2 98%   BMI 24.01 kg/m    Objective:   Physical Exam  Constitutional: She appears well-nourished.  Cardiovascular: Normal rate and regular rhythm.  Respiratory: Effort normal and breath sounds normal.  Musculoskeletal:     Cervical back: Neck supple.  Skin: Skin is warm and dry.  Psychiatric: She has a normal mood and affect.           Assessment & Plan:

## 2020-03-11 NOTE — Patient Instructions (Signed)
You will be contacted regarding your referral to psychiatry.  Please let us know if you have not been contacted within two weeks.   It was a pleasure to see you today!

## 2020-03-11 NOTE — Assessment & Plan Note (Signed)
Overall doing well on Zoloft in the past, struggling with anxiety more so over the last one month. Offered therapy but she would like to meet with psychiatry. Referral placed.

## 2020-03-22 ENCOUNTER — Telehealth: Payer: Self-pay

## 2020-03-22 DIAGNOSIS — F411 Generalized anxiety disorder: Secondary | ICD-10-CM

## 2020-03-22 NOTE — Telephone Encounter (Signed)
Please kindly notify patient I am sorry to hear about her stress and symptoms.  I am copying below what was said to her daughter regarding the psychiatry referral.  "Pts daughter was advised we cannot see mom within our facility but resources were given. She can contact  1. [place] - [phone] [facility]) 2. [place] - [phone]  [facility]) 3. [place] - [phone]  [facility]) 4. [facility] (view website)  Best,   Jonelle Sidle "  I recommend she get in touch with those services.  We can increase her hydroxyzine to 25 mg twice daily, I am happy to send this to her pharmacy if she would like to try.

## 2020-03-22 NOTE — Telephone Encounter (Signed)
Pt calling with bad anxiety; pts brother moved in with her and she is his caregiver, pts daughter is going thru custody battle and pt is under a lot of stress. Pt has been taking zoloft 100 mg once daily and taking hydroxyzine 10 mg bid. Pt has not heard from psychiatrist about an appt yet. Pt last seen on 03/11/20 and pt said she feels about the same as when seen on 03/11/20.pt requesting to get back on ativan because it did help her and the hydroxyzine is not helping pt. Pt feels edgy, shaky, nervous and decreased appetite; pt is having night sweats, pt is tossing and turning all night and may get broken sleep in a total period of 3 - 4 hrs. No SI/HI. Pt request cb. [facility].

## 2020-03-23 MED ORDER — HYDROXYZINE HCL 25 MG PO TABS: 25.0000 mg | ORAL_TABLET | Freq: Two times a day (BID) | ORAL | 0 refills | Status: DC | PRN

## 2020-03-23 NOTE — Telephone Encounter (Signed)
Spoken and notified patient of Morgan Millers comments. Patient would like try the increased of the hydroxyzine. [facility]

## 2020-03-23 NOTE — Telephone Encounter (Signed)
Noted, prescription sent to pharmacy. 

## 2020-04-28 ENCOUNTER — Ambulatory Visit: Payer: Medicare Other | Admitting: Primary Care

## 2020-05-03 DIAGNOSIS — E785 Hyperlipidemia, unspecified: Secondary | ICD-10-CM | POA: Diagnosis not present

## 2020-05-03 DIAGNOSIS — Z79899 Other long term (current) drug therapy: Secondary | ICD-10-CM | POA: Diagnosis not present

## 2020-05-03 DIAGNOSIS — Z1159 Encounter for screening for other viral diseases: Secondary | ICD-10-CM | POA: Diagnosis not present

## 2020-05-03 DIAGNOSIS — E559 Vitamin D deficiency, unspecified: Secondary | ICD-10-CM | POA: Diagnosis not present

## 2020-05-03 DIAGNOSIS — I10 Essential (primary) hypertension: Secondary | ICD-10-CM | POA: Diagnosis not present

## 2020-05-30 DIAGNOSIS — Z79899 Other long term (current) drug therapy: Secondary | ICD-10-CM | POA: Diagnosis not present

## 2020-05-30 DIAGNOSIS — Z0001 Encounter for general adult medical examination with abnormal findings: Secondary | ICD-10-CM | POA: Diagnosis not present

## 2020-05-30 DIAGNOSIS — E785 Hyperlipidemia, unspecified: Secondary | ICD-10-CM | POA: Diagnosis not present

## 2020-05-31 DIAGNOSIS — Z79899 Other long term (current) drug therapy: Secondary | ICD-10-CM | POA: Diagnosis not present

## 2020-06-25 ENCOUNTER — Other Ambulatory Visit: Payer: Self-pay | Admitting: Primary Care

## 2020-06-25 DIAGNOSIS — F411 Generalized anxiety disorder: Secondary | ICD-10-CM

## 2020-06-28 DIAGNOSIS — Z23 Encounter for immunization: Secondary | ICD-10-CM | POA: Diagnosis not present

## 2020-06-28 DIAGNOSIS — Z0000 Encounter for general adult medical examination without abnormal findings: Secondary | ICD-10-CM | POA: Diagnosis not present

## 2020-06-28 DIAGNOSIS — I10 Essential (primary) hypertension: Secondary | ICD-10-CM | POA: Diagnosis not present

## 2020-08-17 ENCOUNTER — Other Ambulatory Visit: Payer: Self-pay | Admitting: Primary Care

## 2020-08-17 DIAGNOSIS — I10 Essential (primary) hypertension: Secondary | ICD-10-CM

## 2020-08-18 ENCOUNTER — Other Ambulatory Visit: Payer: Self-pay | Admitting: *Deleted

## 2020-08-18 DIAGNOSIS — Z87891 Personal history of nicotine dependence: Secondary | ICD-10-CM

## 2020-08-18 DIAGNOSIS — Z122 Encounter for screening for malignant neoplasm of respiratory organs: Secondary | ICD-10-CM

## 2020-08-18 NOTE — Progress Notes (Signed)
Contacted and scheduled for lung screening scan, current smoker, 36.25 pack year

## 2020-08-23 ENCOUNTER — Other Ambulatory Visit: Payer: Self-pay | Admitting: Primary Care

## 2020-08-23 DIAGNOSIS — F411 Generalized anxiety disorder: Secondary | ICD-10-CM

## 2020-08-23 DIAGNOSIS — K219 Gastro-esophageal reflux disease without esophagitis: Secondary | ICD-10-CM

## 2020-09-08 ENCOUNTER — Ambulatory Visit: Admission: RE | Admit: 2020-09-08 | Payer: Medicare Other | Source: Ambulatory Visit

## 2020-09-26 ENCOUNTER — Other Ambulatory Visit: Payer: Self-pay | Admitting: *Deleted

## 2020-09-26 DIAGNOSIS — E782 Mixed hyperlipidemia: Secondary | ICD-10-CM

## 2020-09-26 MED ORDER — SIMVASTATIN 40 MG PO TABS: ORAL_TABLET | ORAL | 3 refills | Status: AC

## 2020-10-12 ENCOUNTER — Ambulatory Visit: Admission: RE | Admit: 2020-10-12 | Payer: Medicare Other | Source: Ambulatory Visit

## 2020-11-07 DIAGNOSIS — I10 Essential (primary) hypertension: Secondary | ICD-10-CM | POA: Diagnosis not present

## 2020-11-30 ENCOUNTER — Telehealth: Payer: Self-pay | Admitting: *Deleted

## 2020-11-30 DIAGNOSIS — Z122 Encounter for screening for malignant neoplasm of respiratory organs: Secondary | ICD-10-CM

## 2020-11-30 DIAGNOSIS — Z87891 Personal history of nicotine dependence: Secondary | ICD-10-CM

## 2020-11-30 NOTE — Telephone Encounter (Signed)
Patient scheduled for lung screeing 12/07/20 4:30PM. She states her insurance is the same and she is still smoking cigarettes.

## 2020-12-01 NOTE — Telephone Encounter (Signed)
Contacted and scheduled for annual lung screening scan. Patient is a current smoker with a 37 pack year history.

## 2020-12-07 ENCOUNTER — Other Ambulatory Visit: Payer: Self-pay

## 2020-12-07 ENCOUNTER — Ambulatory Visit
Admission: RE | Admit: 2020-12-07 | Discharge: 2020-12-07 | Disposition: A | Discharge status: Home / Self Care | Payer: Medicare Other | Source: Ambulatory Visit | Attending: Oncology | Admitting: Oncology

## 2020-12-07 DIAGNOSIS — Z122 Encounter for screening for malignant neoplasm of respiratory organs: Secondary | ICD-10-CM | POA: Diagnosis not present

## 2020-12-07 DIAGNOSIS — Z87891 Personal history of nicotine dependence: Secondary | ICD-10-CM | POA: Diagnosis not present

## 2020-12-15 ENCOUNTER — Encounter: Payer: Self-pay | Admitting: *Deleted

## 2020-12-27 ENCOUNTER — Other Ambulatory Visit: Payer: Self-pay | Admitting: Primary Care

## 2020-12-27 DIAGNOSIS — M810 Age-related osteoporosis without current pathological fracture: Secondary | ICD-10-CM

## 2020-12-29 ENCOUNTER — Other Ambulatory Visit: Payer: Self-pay | Admitting: Primary Care

## 2020-12-29 DIAGNOSIS — M810 Age-related osteoporosis without current pathological fracture: Secondary | ICD-10-CM

## 2021-02-13 DIAGNOSIS — E559 Vitamin D deficiency, unspecified: Secondary | ICD-10-CM | POA: Diagnosis not present

## 2021-02-13 DIAGNOSIS — E785 Hyperlipidemia, unspecified: Secondary | ICD-10-CM | POA: Diagnosis not present

## 2021-02-13 DIAGNOSIS — I2510 Atherosclerotic heart disease of native coronary artery without angina pectoris: Secondary | ICD-10-CM | POA: Diagnosis not present

## 2021-02-13 DIAGNOSIS — I700 Atherosclerosis of aorta: Secondary | ICD-10-CM | POA: Diagnosis not present

## 2021-02-13 DIAGNOSIS — I10 Essential (primary) hypertension: Secondary | ICD-10-CM | POA: Diagnosis not present

## 2021-02-13 DIAGNOSIS — J439 Emphysema, unspecified: Secondary | ICD-10-CM | POA: Diagnosis not present

## 2021-02-13 DIAGNOSIS — G5600 Carpal tunnel syndrome, unspecified upper limb: Secondary | ICD-10-CM | POA: Diagnosis not present

## 2021-02-13 DIAGNOSIS — Z0001 Encounter for general adult medical examination with abnormal findings: Secondary | ICD-10-CM | POA: Diagnosis not present

## 2021-02-13 DIAGNOSIS — I341 Nonrheumatic mitral (valve) prolapse: Secondary | ICD-10-CM | POA: Diagnosis not present

## 2021-02-13 DIAGNOSIS — F17210 Nicotine dependence, cigarettes, uncomplicated: Secondary | ICD-10-CM | POA: Diagnosis not present

## 2021-05-05 ENCOUNTER — Other Ambulatory Visit: Payer: Self-pay | Admitting: Primary Care

## 2021-05-05 DIAGNOSIS — M810 Age-related osteoporosis without current pathological fracture: Secondary | ICD-10-CM

## 2021-07-21 ENCOUNTER — Other Ambulatory Visit: Payer: Self-pay | Admitting: Primary Care

## 2021-07-21 DIAGNOSIS — M810 Age-related osteoporosis without current pathological fracture: Secondary | ICD-10-CM

## 2021-08-25 ENCOUNTER — Other Ambulatory Visit: Payer: Self-pay | Admitting: Primary Care

## 2021-08-25 DIAGNOSIS — I10 Essential (primary) hypertension: Secondary | ICD-10-CM

## 2021-09-25 ENCOUNTER — Other Ambulatory Visit: Payer: Self-pay | Admitting: Primary Care

## 2021-09-25 DIAGNOSIS — F411 Generalized anxiety disorder: Secondary | ICD-10-CM

## 2021-09-25 DIAGNOSIS — K219 Gastro-esophageal reflux disease without esophagitis: Secondary | ICD-10-CM

## 2021-09-25 DIAGNOSIS — I10 Essential (primary) hypertension: Secondary | ICD-10-CM

## 2021-11-02 ENCOUNTER — Other Ambulatory Visit: Payer: Self-pay

## 2021-11-02 ENCOUNTER — Telehealth: Payer: Self-pay

## 2021-11-02 DIAGNOSIS — Z1211 Encounter for screening for malignant neoplasm of colon: Secondary | ICD-10-CM

## 2021-11-02 MED ORDER — NA SULFATE-K SULFATE-MG SULF 17.5-3.13-1.6 GM/177ML PO SOLN: 1.0000 | Freq: Once | ORAL | 0 refills | Status: AC

## 2021-11-02 NOTE — Telephone Encounter (Signed)
CALLED PATIENT NO ANSWER LEFT VOICEMAIL FOR A CALL BACK ? ?

## 2021-11-02 NOTE — Progress Notes (Signed)
Gastroenterology Pre-Procedure Review  Request Date: 11/15/2021 Requesting Physician: Dr. Vicente Males  PATIENT REVIEW QUESTIONS: The patient responded to the following health history questions as indicated:    1. Are you having any GI issues? no 2. Do you have a personal history of Polyps? yes (last colonoscopy) 3. Do you have a family history of Colon Cancer or Polyps? yes (niece had colon cancer) 4. Diabetes Mellitus? no 5. Joint replacements in the past 12 months?no 6. Major health problems in the past 3 months?no 7. Any artificial heart valves, MVP, or defibrillator?no    MEDICATIONS & ALLERGIES:    Patient reports the following regarding taking any anticoagulation/antiplatelet therapy:   Plavix, Coumadin, Eliquis, Xarelto, Lovenox, Pradaxa, Brilinta, or Effient? no Aspirin? no  Patient confirms/reports the following medications:  Current Outpatient Medications  Medication Sig Dispense Refill   acetaminophen (TYLENOL) 325 MG tablet Take 650 mg by mouth every 6 (six) hours as needed.     alendronate (FOSAMAX) 70 MG tablet Take 1 tablet (70 mg total) by mouth every 7 (seven) days. Take with a full glass of water on an empty stomach. Do not lay flat for 1 hour. 12 tablet 3   gabapentin (NEURONTIN) 300 MG capsule Take 1 capsule (300 mg total) by mouth 2 (two) times daily. 180 capsule 3   hydrOXYzine (ATARAX/VISTARIL) 10 MG tablet TAKE 1 TABLET (10 MG TOTAL) BY MOUTH 2 (TWO) TIMES DAILY AS NEEDED FOR ANXIETY. 180 tablet 3   hydrOXYzine (ATARAX/VISTARIL) 25 MG tablet Take 1 tablet (25 mg total) by mouth 2 (two) times daily as needed for anxiety. 60 tablet 0   lisinopril (ZESTRIL) 20 MG tablet TAKE 1 TABLET (20 MG TOTAL) BY MOUTH DAILY. FOR BLOOD PRESSURE. 90 tablet 3   omeprazole (PRILOSEC) 20 MG capsule TAKE 1 CAPSULE (20 MG TOTAL) BY MOUTH DAILY. FOR HEARTBURN 90 capsule 3   sertraline (ZOLOFT) 100 MG tablet TAKE 1 TABLET BY MOUTH EVERY DAY 90 tablet 1   simvastatin (ZOCOR) 40 MG tablet TAKE  1 TABLET (40 MG TOTAL) BY MOUTH NIGHTLY for cholesterol. 90 tablet 3   No current facility-administered medications for this visit.    Patient confirms/reports the following allergies:  No Known Allergies  No orders of the defined types were placed in this encounter.   AUTHORIZATION INFORMATION Primary Insurance: 1D#: Group #:  Secondary Insurance: 1D#: Group #:  SCHEDULE INFORMATION: Date:11/15/2021  Time: Location:[facility]

## 2021-11-04 ENCOUNTER — Other Ambulatory Visit: Payer: Self-pay | Admitting: Registered Nurse

## 2021-11-04 DIAGNOSIS — R928 Other abnormal and inconclusive findings on diagnostic imaging of breast: Secondary | ICD-10-CM

## 2021-11-13 ENCOUNTER — Telehealth: Payer: Self-pay

## 2021-11-13 NOTE — Telephone Encounter (Addendum)
Rescheduled patient because husband has covid rescheduled to 12/25/2021 called endo sent new referral and sent new communications

## 2021-11-14 ENCOUNTER — Other Ambulatory Visit: Payer: Self-pay | Admitting: Registered Nurse

## 2021-11-14 DIAGNOSIS — R928 Other abnormal and inconclusive findings on diagnostic imaging of breast: Secondary | ICD-10-CM

## 2021-11-24 ENCOUNTER — Other Ambulatory Visit: Payer: Medicare Other

## 2021-11-24 ENCOUNTER — Other Ambulatory Visit: Payer: Self-pay

## 2021-11-24 ENCOUNTER — Emergency Department
Admission: EM | Admit: 2021-11-24 | Discharge: 2021-11-24 | Disposition: A | Discharge status: Home / Self Care | Payer: Medicare Other | Attending: Emergency Medicine | Admitting: Emergency Medicine

## 2021-11-24 ENCOUNTER — Inpatient Hospital Stay: Admission: RE | Admit: 2021-11-24 | Payer: Medicare Other | Source: Ambulatory Visit

## 2021-11-24 DIAGNOSIS — R42 Dizziness and giddiness: Secondary | ICD-10-CM | POA: Diagnosis present

## 2021-11-24 DIAGNOSIS — I10 Essential (primary) hypertension: Secondary | ICD-10-CM | POA: Diagnosis not present

## 2021-11-24 DIAGNOSIS — F419 Anxiety disorder, unspecified: Secondary | ICD-10-CM | POA: Insufficient documentation

## 2021-11-24 LAB — CBC
HCT: 42.7 % (ref 36.0–46.0)
Hemoglobin: 14.2 g/dL (ref 12.0–15.0)
MCH: 29.8 pg (ref 26.0–34.0)
MCHC: 33.3 g/dL (ref 30.0–36.0)
MCV: 89.7 fL (ref 80.0–100.0)
Platelets: 257 10*3/uL (ref 150–400)
RBC: 4.76 MIL/uL (ref 3.87–5.11)
RDW: 13.5 % (ref 11.5–15.5)
WBC: 6.4 10*3/uL (ref 4.0–10.5)
nRBC: 0 % (ref 0.0–0.2)

## 2021-11-24 LAB — COMPREHENSIVE METABOLIC PANEL
ALT: 12 U/L (ref 0–44)
AST: 17 U/L (ref 15–41)
Albumin: 4.3 g/dL (ref 3.5–5.0)
Alkaline Phosphatase: 65 U/L (ref 38–126)
Anion gap: 3 — ABNORMAL LOW (ref 5–15)
BUN: 14 mg/dL (ref 8–23)
CO2: 27 mmol/L (ref 22–32)
Calcium: 9.5 mg/dL (ref 8.9–10.3)
Chloride: 108 mmol/L (ref 98–111)
Creatinine, Ser: 0.69 mg/dL (ref 0.44–1.00)
GFR, Estimated: >60 mL/min (ref >60)
Glucose, Bld: 96 mg/dL (ref 70–99)
Potassium: 4.1 mmol/L (ref 3.5–5.1)
Sodium: 138 mmol/L (ref 135–145)
Total Bilirubin: 0.6 mg/dL (ref 0.3–1.2)
Total Protein: 7.2 g/dL (ref 6.5–8.1)

## 2021-11-24 LAB — TROPONIN I (HIGH SENSITIVITY): Troponin I (High Sensitivity): 3 ng/L (ref <18)

## 2021-11-24 NOTE — ED Provider Notes (Signed)
[facility] Provider Note    Event Date/Time   First MD Initiated Contact with Patient 11/24/21 1228     (approximate)  History   Chief Complaint: Dizziness  HPI  Armanie Ullmer is a 66 y.o. female with a past medical history of anxiety, hypertension, hyperlipidemia who presents to the emergency department for dizziness and high blood pressure.  According to the patient yesterday she was feeling somewhat dizzy so she took her blood pressure.  States it was elevated around 160, this morning she took her blood pressure again and it was around 160, states she took it later and it went up, took it again and it was over 200 which scared her so she came to the emergency department for evaluation.  Patient denies any chest pain.  Denies any headache weakness or numbness.  Patient does state a history of anxiety, took Ativan, blood pressure currently 141/85 without any ER intervention.  Physical Exam   Triage Vital Signs: ED Triage Vitals [11/24/21 1053]  Enc Vitals Group     BP (!) 167/108     Pulse Rate 87     Resp 20     Temp 98.5 F (36.9 C)     Temp Source Oral     SpO2 98 %     Weight 125 lb (56.7 kg)     Height 5' (1.524 m)     Head Circumference      Peak Flow      Pain Score 0     Pain Loc      Pain Edu?      Excl. in [place]?     Most recent vital signs: Vitals:   11/24/21 1222 11/24/21 1230  BP:  (!) 141/85  Pulse: 63 61  Resp: (!) 22 (!) 26  Temp:    SpO2: 98% 99%    General: Awake, no distress.  CV:  Good peripheral perfusion.  Regular rate and rhythm  Resp:  Normal effort.  Equal breath sounds bilaterally.  Abd:  No distention.  Soft, nontender.  No rebound or guarding. Other:  Patient is somewhat anxious appearing   ED Results / Procedures / Treatments   EKG  EKG viewed and interpreted by myself shows a normal sinus rhythm at 70 bpm with a narrow QRS, normal axis, normal intervals, no concerning ST changes.  Reassuring  EKG.  MEDICATIONS ORDERED IN ED: Medications - No data to display   IMPRESSION / MDM / [place] / ED COURSE  I reviewed the triage vital signs and the nursing notes.  Patient presents to the emergency department for high blood pressure.  Patient states her blood pressure got over 200 at home which scared her so she came to the emergency department.  Here the patient appears well, blood pressure currently 141/85 without intervention.  Patient does state a history of anxiety, appears somewhat anxious.  Patient's physical exam is reassuring, neurological exam is reassuring.  Equal grip strengths.  No pronator drift.  5/5 motor in all extremities.  No cranial nerve findings.  Patient's lab work has resulted largely within normal limits as well.  Normal CBC, reassuring chemistry including normal renal function.  Negative troponin.  I did review the patient's recent PCP note from 11/10/2021.  Patient states she is just getting over [place].  I suspect some of the patient's symptoms could be COVID-related.  Reassuringly patient's blood pressures come down without ER intervention possibly indicating a degree of anxiety as well  as a role for the patient's hypertension.  Given the patient's reassuring physical exam and neurological exam I believe the patient will be safe for discharge home with PCP follow-up.  Patient is agreeable this plan of care.  FINAL CLINICAL IMPRESSION(S) / ED DIAGNOSES   Hypertension  Note:  This document was prepared using Dragon voice recognition software and may include unintentional dictation errors.   Harvest Dark, MD 11/24/21 1252

## 2021-11-24 NOTE — ED Triage Notes (Signed)
Pt in with co dizziness, facial tingling, ears ringing, and feeling "off balance" since yesterday. Denies any  hx of the same.

## 2021-11-24 NOTE — ED Notes (Signed)
ED Provider at bedside. 

## 2021-12-06 ENCOUNTER — Ambulatory Visit
Admission: RE | Admit: 2021-12-06 | Discharge: 2021-12-06 | Disposition: A | Discharge status: Home / Self Care | Payer: Medicare Other | Source: Ambulatory Visit | Attending: Registered Nurse | Admitting: Registered Nurse

## 2021-12-06 ENCOUNTER — Other Ambulatory Visit: Payer: Self-pay

## 2021-12-06 DIAGNOSIS — R928 Other abnormal and inconclusive findings on diagnostic imaging of breast: Secondary | ICD-10-CM | POA: Insufficient documentation

## 2021-12-22 ENCOUNTER — Encounter: Payer: Self-pay | Admitting: Gastroenterology

## 2021-12-25 ENCOUNTER — Ambulatory Visit: Payer: Medicare Other | Admitting: Anesthesiology

## 2021-12-25 ENCOUNTER — Encounter
Admission: RE | Disposition: A | Discharge status: Home / Self Care | Payer: Self-pay | Source: Home / Self Care | Attending: Gastroenterology

## 2021-12-25 ENCOUNTER — Other Ambulatory Visit: Payer: Self-pay

## 2021-12-25 ENCOUNTER — Ambulatory Visit
Admission: RE | Admit: 2021-12-25 | Discharge: 2021-12-25 | Disposition: A | Discharge status: Home / Self Care | Payer: Medicare Other | Attending: Gastroenterology | Admitting: Gastroenterology

## 2021-12-25 ENCOUNTER — Encounter: Payer: Self-pay | Admitting: Gastroenterology

## 2021-12-25 DIAGNOSIS — I341 Nonrheumatic mitral (valve) prolapse: Secondary | ICD-10-CM | POA: Diagnosis not present

## 2021-12-25 DIAGNOSIS — F32A Depression, unspecified: Secondary | ICD-10-CM | POA: Insufficient documentation

## 2021-12-25 DIAGNOSIS — D122 Benign neoplasm of ascending colon: Secondary | ICD-10-CM | POA: Diagnosis not present

## 2021-12-25 DIAGNOSIS — K635 Polyp of colon: Secondary | ICD-10-CM | POA: Diagnosis not present

## 2021-12-25 DIAGNOSIS — F17210 Nicotine dependence, cigarettes, uncomplicated: Secondary | ICD-10-CM | POA: Diagnosis not present

## 2021-12-25 DIAGNOSIS — Z9049 Acquired absence of other specified parts of digestive tract: Secondary | ICD-10-CM | POA: Insufficient documentation

## 2021-12-25 DIAGNOSIS — D125 Benign neoplasm of sigmoid colon: Secondary | ICD-10-CM | POA: Insufficient documentation

## 2021-12-25 DIAGNOSIS — E785 Hyperlipidemia, unspecified: Secondary | ICD-10-CM | POA: Insufficient documentation

## 2021-12-25 DIAGNOSIS — Z86010 Personal history of colonic polyps: Secondary | ICD-10-CM

## 2021-12-25 DIAGNOSIS — I10 Essential (primary) hypertension: Secondary | ICD-10-CM | POA: Diagnosis not present

## 2021-12-25 DIAGNOSIS — K219 Gastro-esophageal reflux disease without esophagitis: Secondary | ICD-10-CM | POA: Diagnosis not present

## 2021-12-25 DIAGNOSIS — Z1211 Encounter for screening for malignant neoplasm of colon: Secondary | ICD-10-CM | POA: Diagnosis present

## 2021-12-25 DIAGNOSIS — K5730 Diverticulosis of large intestine without perforation or abscess without bleeding: Secondary | ICD-10-CM | POA: Insufficient documentation

## 2021-12-25 HISTORY — PX: COLONOSCOPY WITH PROPOFOL: SHX5780

## 2021-12-25 SURGERY — COLONOSCOPY WITH PROPOFOL
Anesthesia: General

## 2021-12-25 MED ORDER — PROPOFOL 10 MG/ML IV BOLUS
INTRAVENOUS | Status: DC | PRN
  Administered 2021-12-25: 80 mg via INTRAVENOUS

## 2021-12-25 MED ORDER — LIDOCAINE HCL (CARDIAC) PF 100 MG/5ML IV SOSY
PREFILLED_SYRINGE | INTRAVENOUS | Status: DC | PRN
  Administered 2021-12-25: 40 mg via INTRAVENOUS

## 2021-12-25 MED ORDER — PROPOFOL 500 MG/50ML IV EMUL
INTRAVENOUS | Status: DC | PRN
  Administered 2021-12-25: 150 ug/kg/min via INTRAVENOUS

## 2021-12-25 MED ORDER — SODIUM CHLORIDE 0.9 % IV SOLN: INTRAVENOUS | Status: DC

## 2021-12-25 MED ORDER — PROPOFOL 500 MG/50ML IV EMUL
INTRAVENOUS | Status: AC
  Filled 2021-12-25: qty 50

## 2021-12-25 NOTE — Transfer of Care (Signed)
Immediate Anesthesia Transfer of Care Note ? ?Patient: Morgan Riley ? ?Procedure(s) Performed: Procedure(s): ?COLONOSCOPY WITH PROPOFOL (N/A) ? ?Patient Location: PACU and Endoscopy Unit ? ?Anesthesia Type:General ? ?Level of Consciousness: sedated ? ?Airway & Oxygen Therapy: Patient Spontanous Breathing and Patient connected to nasal cannula oxygen ? ?Post-op Assessment: Report given to RN and Post -op Vital signs reviewed and stable ? ?Post vital signs: Reviewed and stable ? ?Last Vitals:  ?Vitals:  ? 12/25/21 0838 12/25/21 0840  ?BP: (!) 111/59   ?Pulse: 72 69  ?Resp: (!) 22 (!) 23  ?Temp:    ?SpO2: 98% 97%  ? ? ?Complications: No apparent anesthesia complications ?

## 2021-12-25 NOTE — Anesthesia Procedure Notes (Signed)
Date/Time: 12/25/2021 8:17 AM ?Performed by: Doreen Salvage, CRNA ?Pre-anesthesia Checklist: Patient identified, Emergency Drugs available, Suction available and Patient being monitored ?Patient Re-evaluated:Patient Re-evaluated prior to induction ?Oxygen Delivery Method: Nasal cannula ?Induction Type: IV induction ?Dental Injury: Teeth and Oropharynx as per pre-operative assessment  ?Comments: Nasal cannula with etCO2 monitoring ? ? ? ? ?

## 2021-12-25 NOTE — Anesthesia Preprocedure Evaluation (Signed)
Anesthesia Evaluation  ?Patient identified by MRN, date of birth, ID band ?Patient awake ? ? ? ?Reviewed: ?Allergy & Precautions, NPO status , Patient's Chart, lab work & pertinent test results ? ?History of Anesthesia Complications ?Negative for: history of anesthetic complications ? ?Airway ?Mallampati: III ? ?TM Distance: >3 FB ?Neck ROM: full ? ? ? Dental ? ?(+) Chipped, Missing, Partial Lower, Upper Dentures ?  ?Pulmonary ?neg shortness of breath, Current Smoker and Patient abstained from smoking.,  ?  ?Pulmonary exam normal ? ? ? ? ? ? ? Cardiovascular ?Exercise Tolerance: Good ?hypertension, (-) anginaNormal cardiovascular exam ? ? ?  ?Neuro/Psych ?PSYCHIATRIC DISORDERS  Neuromuscular disease negative psych ROS  ? GI/Hepatic ?Neg liver ROS, GERD  Controlled,  ?Endo/Other  ?negative endocrine ROS ? Renal/GU ?negative Renal ROS  ?negative genitourinary ?  ?Musculoskeletal ? ?(+) Arthritis ,  ? Abdominal ?  ?Peds ? Hematology ?negative hematology ROS ?(+)   ?Anesthesia Other Findings ?Past Medical History: ?No date: Allergy ?No date: Arthritis ?No date: Carpal tunnel syndrome ?No date: Depression ?No date: GERD (gastroesophageal reflux disease) ?No date: Hyperlipidemia ?No date: Hypertension ?No date: Mitral valve prolapse ? ?Past Surgical History: ?No date: CHOLECYSTECTOMY ?08/06/2017: COLONOSCOPY WITH PROPOFOL; N/A ?    Comment:  Procedure: COLONOSCOPY WITH PROPOFOL;  Surgeon: Allen Norris,  ?             Darren, MD;  Location: [facility];  Service:  ?             Endoscopy;  Laterality: N/A; ?1987: GALLBLADDER SURGERY ?No date: tubes repair ?    Comment:  removed one ovary and  fallopian tube ?No date: tumor remove in right leg ? ?BMI   ? Body Mass Index: 23.62 kg/m?  ?  ? ? Reproductive/Obstetrics ?negative OB ROS ? ?  ? ? ? ? ? ? ? ? ? ? ? ? ? ?  ?  ? ? ? ? ? ? ? ? ?Anesthesia Physical ?Anesthesia Plan ? ?ASA: 3 ? ?Anesthesia Plan: General  ? ?Post-op Pain Management:    ? ?Induction: Intravenous ? ?PONV Risk Score and Plan: Propofol infusion and TIVA ? ?Airway Management Planned: Natural Airway and Nasal Cannula ? ?Additional Equipment:  ? ?Intra-op Plan:  ? ?Post-operative Plan:  ? ?Informed Consent: I have reviewed the patients History and Physical, chart, labs and discussed the procedure including the risks, benefits and alternatives for the proposed anesthesia with the patient or authorized representative who has indicated his/her understanding and acceptance.  ? ? ? ?Dental Advisory Given ? ?Plan Discussed with: Anesthesiologist, CRNA and Surgeon ? ?Anesthesia Plan Comments: (Patient consented for risks of anesthesia including but not limited to:  ?- adverse reactions to medications ?- risk of airway placement if required ?- damage to eyes, teeth, lips or other oral mucosa ?- nerve damage due to positioning  ?- sore throat or hoarseness ?- Damage to heart, brain, nerves, lungs, other parts of body or loss of life ? ?Patient voiced understanding.)  ? ? ? ? ? ? ?Anesthesia Quick Evaluation ? ?

## 2021-12-25 NOTE — Anesthesia Postprocedure Evaluation (Signed)
Anesthesia Post Note ? ?Patient: Dezirae Service ? ?Procedure(s) Performed: COLONOSCOPY WITH PROPOFOL ? ?Patient location during evaluation: Endoscopy ?Anesthesia Type: General ?Level of consciousness: awake and alert ?Pain management: pain level controlled ?Vital Signs Assessment: post-procedure vital signs reviewed and stable ?Respiratory status: spontaneous breathing, nonlabored ventilation, respiratory function stable and patient connected to nasal cannula oxygen ?Cardiovascular status: blood pressure returned to baseline and stable ?Postop Assessment: no apparent nausea or vomiting ?Anesthetic complications: no ? ? ?No notable events documented. ? ? ?Last Vitals:  ?Vitals:  ? 12/25/21 0850 12/25/21 0900  ?BP: (!) 153/91 (!) 142/100  ?Pulse: 63 63  ?Resp: 19 19  ?Temp:    ?SpO2: 99% 99%  ?  ?Last Pain:  ?Vitals:  ? 12/25/21 0830  ?TempSrc: Temporal  ?PainSc:   ? ? ?  ?  ?  ?  ?  ?  ? ?Precious Haws Kaelani Kendrick ? ? ? ? ?

## 2021-12-25 NOTE — H&P (Signed)
?Jonathon Bellows, MD ?[address], [place], [place], [place], [address] ?[address], [place], [place], [place], [address] ?Phone: [phone]  ?Fax: [phone] ? ?Primary Care Physician:  Arthur Holms, NP ? ? ?Pre-Procedure History & Physical: ?HPI:  Mariangel Ringley is a 66 y.o. female is here for an colonoscopy. ?  ?Past Medical History:  ?Diagnosis Date  ? Allergy   ? Arthritis   ? Carpal tunnel syndrome   ? Depression   ? GERD (gastroesophageal reflux disease)   ? Hyperlipidemia   ? Hypertension   ? Mitral valve prolapse   ? ? ?Past Surgical History:  ?Procedure Laterality Date  ? CHOLECYSTECTOMY    ? COLONOSCOPY WITH PROPOFOL N/A 08/06/2017  ? Procedure: COLONOSCOPY WITH PROPOFOL;  Surgeon: Lucilla Lame, MD;  Location: [facility] ENDOSCOPY;  Service: Endoscopy;  Laterality: N/A;  ? [place]  ? tubes repair    ? removed one ovary and  fallopian tube  ? tumor remove in right leg    ? ? ?Prior to Admission medications   ?Medication Sig Start Date End Date Taking? Authorizing Provider  ?alendronate (FOSAMAX) 70 MG tablet Take 1 tablet (70 mg total) by mouth every 7 (seven) days. Take with a full glass of water on an empty stomach. Do not lay flat for 1 hour. 02/22/20  Yes Pleas Koch, NP  ?atorvastatin (LIPITOR) 40 MG tablet Take 40 mg by mouth at bedtime. 08/13/21  Yes [provider]  ?gabapentin (NEURONTIN) 300 MG capsule Take 1 capsule (300 mg total) by mouth 2 (two) times daily. 09/09/18  Yes Pleas Koch, NP  ?hydrOXYzine (ATARAX/VISTARIL) 10 MG tablet TAKE 1 TABLET (10 MG TOTAL) BY MOUTH 2 (TWO) TIMES DAILY AS NEEDED FOR ANXIETY. 08/25/20  Yes Pleas Koch, NP  ?lisinopril (ZESTRIL) 20 MG tablet TAKE 1 TABLET (20 MG TOTAL) BY MOUTH DAILY. FOR BLOOD PRESSURE. 08/18/20  Yes Pleas Koch, NP  ?LORazepam (ATIVAN) 0.5 MG tablet Take 0.5 mg by mouth daily as needed. 09/29/21  Yes [provider]  ?meloxicam (MOBIC) 7.5 MG tablet Take by mouth. 10/19/21  Yes  [provider]  ?omeprazole (PRILOSEC) 20 MG capsule TAKE 1 CAPSULE (20 MG TOTAL) BY MOUTH DAILY. FOR HEARTBURN 08/25/20  Yes Pleas Koch, NP  ?sertraline (ZOLOFT) 100 MG tablet TAKE 1 TABLET BY MOUTH EVERY DAY 06/27/20  Yes Pleas Koch, NP  ?simvastatin (ZOCOR) 40 MG tablet TAKE 1 TABLET (40 MG TOTAL) BY MOUTH NIGHTLY for cholesterol. 09/26/20  Yes Masoud, Viann Shove, MD  ?acetaminophen (TYLENOL) 325 MG tablet Take 650 mg by mouth every 6 (six) hours as needed.    [provider]  ? ? ?Allergies as of 11/02/2021  ? (No Known Allergies)  ? ? ?Family History  ?Problem Relation Age of Onset  ? Arthritis Mother   ? Stroke Mother   ? Hypertension Mother   ? Mental illness Mother   ? Diabetes Mother   ? Arthritis Father   ? Heart disease Father   ? Hypertension Father   ? Mental illness Sister   ? Mental illness Sister   ? Breast cancer Neg Hx   ? ? ?Social History  ? ?Socioeconomic History  ? Marital status: Married  ?  Spouse name: Not on file  ? Number of children: Not on file  ? Years of education: Not on file  ? Highest education level: Not on file  ?Occupational History  ? Not on file  ?Tobacco Use  ?  Smoking status: Every Day  ?  Packs/day: 0.50  ?  Years: 35.00  ?  Pack years: 17.50  ?  Types: Cigarettes  ? Smokeless tobacco: Never  ?Vaping Use  ? Vaping Use: Never used  ?Substance and Sexual Activity  ? Alcohol use: No  ? Drug use: No  ? Sexual activity: Not on file  ?Other Topics Concern  ? Not on file  ?Social History Narrative  ? Not on file  ? ?Social Determinants of Health  ? ?Financial Resource Strain: Not on file  ?Food Insecurity: Not on file  ?Transportation Needs: Not on file  ?Physical Activity: Not on file  ?Stress: Not on file  ?Social Connections: Not on file  ?Intimate Partner Violence: Not on file  ? ? ?Review of Systems: ?See HPI, otherwise negative ROS ? ?Physical Exam: ?BP (!) 155/87   Pulse 72   Temp (!) 96.4 ?F (35.8 ?C) (Temporal)   Resp 18   Ht '5\' 1"'$  (1.549  m)   Wt 56.7 kg   SpO2 97%   BMI 23.62 kg/m?  ?General:   Alert,  pleasant and cooperative in NAD ?Head:  Normocephalic and atraumatic. ?Neck:  Supple; no masses or thyromegaly. ?Lungs:  Clear throughout to auscultation, normal respiratory effort.    ?Heart:  +S1, +S2, Regular rate and rhythm, No edema. ?Abdomen:  Soft, nontender and nondistended. Normal bowel sounds, without guarding, and without rebound.   ?Neurologic:  Alert and  oriented x4;  grossly normal neurologically. ? ?Impression/Plan: ?Kleo Dungee is here for an colonoscopy to be performed for surveillance due to prior history of colon polyps  ? ?Risks, benefits, limitations, and alternatives regarding  colonoscopy have been reviewed with the patient.  Questions have been answered.  All parties agreeable. ? ? ?Jonathon Bellows, MD  12/25/2021, 8:05 AM ? ?

## 2021-12-25 NOTE — Op Note (Signed)
[facility] ?Gastroenterology ?Patient Name: Melora Menon ?Procedure Date: 12/25/2021 8:07 AM ?MRN: [id number] ?Account #: [id number] ?Date of Birth: [birth year] ?Admit Type: Outpatient ?Age: 66 ?Room: [facility] ENDO ROOM 4 ?Gender: Female ?Note Status: Finalized ?Instrument Name: Colonoscope [id number] ?Procedure:             Colonoscopy ?Indications:           Surveillance: Personal history of adenomatous polyps  ?                       on last colonoscopy 5 years ago ?Providers:             Jonathon Bellows MD, MD ?Medicines:             Monitored Anesthesia Care ?Complications:         No immediate complications. ?Procedure:             Pre-Anesthesia Assessment: ?                       - Prior to the procedure, a History and Physical was  ?                       performed, and patient medications, allergies and  ?                       sensitivities were reviewed. The patient's tolerance  ?                       of previous anesthesia was reviewed. ?                       - The risks and benefits of the procedure and the  ?                       sedation options and risks were discussed with the  ?                       patient. All questions were answered and informed  ?                       consent was obtained. ?                       - ASA Grade Assessment: II - A patient with mild  ?                       systemic disease. ?                       - Prior to the procedure, a History and Physical was  ?                       performed, and patient medications, allergies and  ?                       sensitivities were reviewed. The patient's tolerance  ?                       of previous anesthesia was reviewed. ?                       -  The risks and benefits of the procedure and the  ?                       sedation options and risks were discussed with the  ?                       patient. All questions were answered and informed  ?                       consent was obtained. ?                        After obtaining informed consent, the colonoscope was  ?                       passed under direct vision. Throughout the procedure,  ?                       the patient's blood pressure, pulse, and oxygen  ?                       saturations were monitored continuously. The  ?                       Colonoscope was introduced through the anus and  ?                       advanced to the the cecum, identified by the  ?                       appendiceal orifice. The colonoscopy was performed  ?                       without difficulty. The patient tolerated the  ?                       procedure well. The quality of the bowel preparation  ?                       was excellent. ?Findings: ?     The perianal and digital rectal examinations were normal. ?     A 5 mm polyp was found in the sigmoid colon. The polyp was sessile. The  ?     polyp was removed with a cold snare. Resection and retrieval were  ?     complete. ?     Multiple small-mouthed diverticula were found in the sigmoid colon. ?     Four sessile polyps were found in the ascending colon. The polyps were 3  ?     to 6 mm in size. These polyps were removed with a cold snare. Resection  ?     and retrieval were complete. ?     The exam was otherwise without abnormality on direct and retroflexion  ?     views. ?Impression:            - One 5 mm polyp in the sigmoid colon, removed with a  ?                       cold snare. Resected and retrieved. ?                       -  Diverticulosis in the sigmoid colon. ?                       - Four 3 to 6 mm polyps in the ascending colon,  ?                       removed with a cold snare. Resected and retrieved. ?                       - The examination was otherwise normal on direct and  ?                       retroflexion views. ?Recommendation:        - Discharge patient to home (with escort). ?                       - Resume previous diet. ?                       - Continue present medications. ?                        - Await pathology results. ?                       - Repeat colonoscopy in 3 years for surveillance. ?Procedure Code(s):     --- Professional --- ?                       [phone], Colonoscopy, flexible; with removal of  ?                       tumor(s), polyp(s), or other lesion(s) by snare  ?                       technique ?Diagnosis Code(s):     --- Professional --- ?                       K63.5, Polyp of colon ?                       Z86.010, Personal history of colonic polyps ?                       K57.30, Diverticulosis of large intestine without  ?                       perforation or abscess without bleeding ?CPT copyright 2019 American Medical Association. All rights reserved. ?The codes documented in this report are preliminary and upon coder review may  ?be revised to meet current compliance requirements. ?Jonathon Bellows, MD ?Jonathon Bellows MD, MD ?12/25/2021 8:36:48 AM ?This report has been signed electronically. ?Number of Addenda: 0 ?Note Initiated On: 12/25/2021 8:07 AM ?Scope Withdrawal Time: 0 hours 11 minutes 47 seconds  ?Total Procedure Duration: 0 hours 15 minutes 13 seconds  ?Estimated Blood Loss:  Estimated blood loss: none. ?     [employer] ?

## 2021-12-26 ENCOUNTER — Encounter: Payer: Self-pay | Admitting: Gastroenterology

## 2021-12-26 LAB — SURGICAL PATHOLOGY

## 2021-12-28 ENCOUNTER — Encounter: Payer: Self-pay | Admitting: Gastroenterology

## 2022-01-29 ENCOUNTER — Other Ambulatory Visit: Payer: Self-pay | Admitting: *Deleted

## 2022-01-29 DIAGNOSIS — Z122 Encounter for screening for malignant neoplasm of respiratory organs: Secondary | ICD-10-CM

## 2022-01-29 DIAGNOSIS — F17210 Nicotine dependence, cigarettes, uncomplicated: Secondary | ICD-10-CM

## 2022-01-29 DIAGNOSIS — Z87891 Personal history of nicotine dependence: Secondary | ICD-10-CM

## 2022-02-13 ENCOUNTER — Ambulatory Visit: Payer: Medicare Other

## 2022-04-25 ENCOUNTER — Ambulatory Visit
Admission: RE | Admit: 2022-04-25 | Discharge: 2022-04-25 | Disposition: A | Discharge status: Home / Self Care | Payer: Medicare Other | Source: Ambulatory Visit | Attending: Acute Care | Admitting: Acute Care

## 2022-04-25 DIAGNOSIS — Z122 Encounter for screening for malignant neoplasm of respiratory organs: Secondary | ICD-10-CM | POA: Insufficient documentation

## 2022-04-25 DIAGNOSIS — Z87891 Personal history of nicotine dependence: Secondary | ICD-10-CM | POA: Insufficient documentation

## 2022-04-25 DIAGNOSIS — F17210 Nicotine dependence, cigarettes, uncomplicated: Secondary | ICD-10-CM | POA: Diagnosis present

## 2022-05-04 ENCOUNTER — Telehealth: Payer: Self-pay | Admitting: Acute Care

## 2022-05-04 DIAGNOSIS — Z87891 Personal history of nicotine dependence: Secondary | ICD-10-CM

## 2022-05-04 DIAGNOSIS — F17210 Nicotine dependence, cigarettes, uncomplicated: Secondary | ICD-10-CM

## 2022-05-04 DIAGNOSIS — Z122 Encounter for screening for malignant neoplasm of respiratory organs: Secondary | ICD-10-CM

## 2022-05-04 NOTE — Telephone Encounter (Signed)
Spoke with Morgan Riley and reviewed lung screening CT results. Advised Morgan Riley that there were a few small nodules seen but nothing suspicious and we will follow these with her next CT scan in 12 months. Morgan Riley also advised that there is some notation of scarring in the lungs that had progressed since her last CT. Morgan Riley does report having increased SOB in the evenings. I advised Morgan Riley that we would like her to be seen by one of our pulmonary physicians that specialize in this. Morgan Riley is in agreement and has been scheduled to See Dr Vaughan Browner 05/31/22 . CT results faxed to PCP. Order has been placed for 12 mth f/u low dose CT.

## 2022-05-31 ENCOUNTER — Encounter: Payer: Self-pay | Admitting: Pulmonary Disease

## 2022-05-31 ENCOUNTER — Ambulatory Visit: Payer: Medicare Other | Admitting: Pulmonary Disease

## 2022-05-31 VITALS — BP 144/64 | HR 68 | Temp 97.9°F | Ht 61.0 in | Wt 132.6 lb

## 2022-05-31 DIAGNOSIS — F17210 Nicotine dependence, cigarettes, uncomplicated: Secondary | ICD-10-CM

## 2022-05-31 DIAGNOSIS — J849 Interstitial pulmonary disease, unspecified: Secondary | ICD-10-CM

## 2022-05-31 LAB — CK: Total CK: 49 U/L (ref 7–177)

## 2022-05-31 NOTE — Patient Instructions (Signed)
We will get some labs today for evaluation of interstitial lung disease Schedule high-res CT and PFTs Return to clinic in 2 to 3 months for review and plan for next steps

## 2022-05-31 NOTE — Progress Notes (Signed)
Morgan Riley    [id number]    1956/07/29  Primary Care Physician:Nguyen, Maudie Mercury, NP  Referring Physician: Arthur Holms, NP [address] [place],  [place] [address]  Chief complaint: Consult for interstitial lung disease  HPI: 66 y.o. who  has a past medical history of Allergy, Arthritis, Carpal tunnel syndrome, Depression, GERD (gastroesophageal reflux disease), Hyperlipidemia, Hypertension, and Mitral valve prolapse.   Here for evaluation of abnormal screening CT that showed mild interstitial lung disease.  Has mild dyspnea on exertion.  No cough or congestion.  She is just on albuterol inhaler. Has no significant exposures noted.  Reports arthritis in the right knee and small joints of the hand with morning stiffness.  Also has dry mouth and dry eyes.  Denies any rash, difficulty swallowing Reports 2 episodes of COVID-19 virus and pneumonia earlier this year.  She did not require hospitalization for this.  Pets: Dogs, cats.  Had a parrot for a few years until 2010 Occupation: Retired.  Used to be a homemaker Exposures: No mold, hot tub, Jacuzzi.  No feather pillows or comforter ILD questionnaire 05/31/2022-negative Smoking history: 35-pack-year smoker.  Continues to smoke 1 and half packs per day Travel history: Originally from [place].  No significant recent travel Relevant family history: Sister had emphysema.  She was a smoker.   Outpatient Encounter Medications as of 05/31/2022  Medication Sig   acetaminophen (TYLENOL) 325 MG tablet Take 650 mg by mouth every 6 (six) hours as needed.   alendronate (FOSAMAX) 70 MG tablet Take 1 tablet (70 mg total) by mouth every 7 (seven) days. Take with a full glass of water on an empty stomach. Do not lay flat for 1 hour.   atorvastatin (LIPITOR) 40 MG tablet Take 40 mg by mouth at bedtime.   doxycycline (VIBRAMYCIN) 100 MG capsule Take 100 mg by mouth 2 (two) times daily.   gabapentin (NEURONTIN) 300 MG capsule Take 1 capsule (300 mg  total) by mouth 2 (two) times daily.   hydrOXYzine (ATARAX/VISTARIL) 10 MG tablet TAKE 1 TABLET (10 MG TOTAL) BY MOUTH 2 (TWO) TIMES DAILY AS NEEDED FOR ANXIETY.   lisinopril (ZESTRIL) 20 MG tablet TAKE 1 TABLET (20 MG TOTAL) BY MOUTH DAILY. FOR BLOOD PRESSURE.   LORazepam (ATIVAN) 0.5 MG tablet Take 0.5 mg by mouth daily as needed.   meloxicam (MOBIC) 7.5 MG tablet Take by mouth.   omeprazole (PRILOSEC) 20 MG capsule TAKE 1 CAPSULE (20 MG TOTAL) BY MOUTH DAILY. FOR HEARTBURN   predniSONE (DELTASONE) 20 MG tablet Take by mouth.   sertraline (ZOLOFT) 100 MG tablet TAKE 1 TABLET BY MOUTH EVERY DAY   simvastatin (ZOCOR) 40 MG tablet TAKE 1 TABLET (40 MG TOTAL) BY MOUTH NIGHTLY for cholesterol.   No facility-administered encounter medications on file as of 05/31/2022.    Allergies as of 05/31/2022   (No Known Allergies)    Past Medical History:  Diagnosis Date   Allergy    Arthritis    Carpal tunnel syndrome    Depression    GERD (gastroesophageal reflux disease)    Hyperlipidemia    Hypertension    Mitral valve prolapse     Past Surgical History:  Procedure Laterality Date   CHOLECYSTECTOMY     COLONOSCOPY WITH PROPOFOL N/A 08/06/2017   Procedure: COLONOSCOPY WITH PROPOFOL;  Surgeon: Lucilla Lame, MD;  Location: [facility] ENDOSCOPY;  Service: Endoscopy;  Laterality: N/A;   COLONOSCOPY WITH PROPOFOL N/A 12/25/2021   Procedure: COLONOSCOPY WITH  PROPOFOL;  Surgeon: Jonathon Bellows, MD;  Location: [facility] ENDOSCOPY;  Service: Gastroenterology;  Laterality: N/A;   GALLBLADDER SURGERY  1987   tubes repair     removed one ovary and  fallopian tube   tumor remove in right leg      Family History  Problem Relation Age of Onset   Arthritis Mother    Stroke Mother    Hypertension Mother    Mental illness Mother    Diabetes Mother    Arthritis Father    Heart disease Father    Hypertension Father    Mental illness Sister    Mental illness Sister    Breast cancer Neg Hx     Social History    Socioeconomic History   Marital status: Married    Spouse name: Not on file   Number of children: Not on file   Years of education: Not on file   Highest education level: Not on file  Occupational History   Not on file  Tobacco Use   Smoking status: Every Day    Packs/day: 1.00    Years: 35.00    Total pack years: 35.00    Types: Cigarettes   Smokeless tobacco: Never   Tobacco comments:    3/4 to 1 pack a day 05/31/22  Vaping Use   Vaping Use: Never used  Substance and Sexual Activity   Alcohol use: No   Drug use: No   Sexual activity: Not on file  Other Topics Concern   Not on file  Social History Narrative   Not on file   Social Determinants of Health   Financial Resource Strain: Not on file  Food Insecurity: Not on file  Transportation Needs: Not on file  Physical Activity: Not on file  Stress: Not on file  Social Connections: Not on file  Intimate Partner Violence: Not on file    Review of systems: Review of Systems  Constitutional: Negative for fever and chills.  HENT: Negative.   Eyes: Negative for blurred vision.  Respiratory: as per HPI  Cardiovascular: Negative for chest pain and palpitations.  Gastrointestinal: Negative for vomiting, diarrhea, blood per rectum. Genitourinary: Negative for dysuria, urgency, frequency and hematuria.  Musculoskeletal: Negative for myalgias, back pain and joint pain.  Skin: Negative for itching and rash.  Neurological: Negative for dizziness, tremors, focal weakness, seizures and loss of consciousness.  Endo/Heme/Allergies: Negative for environmental allergies.  Psychiatric/Behavioral: Negative for depression, suicidal ideas and hallucinations.  All other systems reviewed and are negative.  Physical Exam: Blood pressure (!) 144/64, pulse 68, temperature 97.9 F (36.6 C), temperature source Oral, height '5\' 1"'$  (1.549 m), weight 132 lb 9.6 oz (60.1 kg), SpO2 98 %. Gen:      No acute distress HEENT:  EOMI, sclera  anicteric Neck:     No masses; no thyromegaly Lungs:    Clear to auscultation bilaterally; normal respiratory effort CV:         Regular rate and rhythm; no murmurs Abd:      + bowel sounds; soft, non-tender; no palpable masses, no distension Ext:    No edema; adequate peripheral perfusion Skin:      Warm and dry; no rash Neuro: alert and oriented x 3 Psych: normal mood and affect  Data Reviewed: Imaging: Screening CT chest 3/2/622-centrilobular and paraseptal emphysema.  Minimal groundglass reticulation Screening CT 04/25/2022-mild subpleural reticulation bilaterally, lower lobe predominant.  Mildly progressed compared to 2022 I have reviewed the images personally.  PFTs:  Labs:  Assessment:  Abnormal CT Her CT shows reticulation bilaterally with basilar predominance.  It had COVID-19 earlier this year which appeared to be mild case though her changes was seen back in 2022  She does have joint symptoms and will get CTD serologies for evaluation.  Schedule high-res CT and PFTs for better evaluation of the lung Return to clinic in 2 to 3 months for review and plan for next steps.  Plan/Recommendations: CTD serologies High-resolution CT, PFTs.  Marshell Garfinkel MD [facility] 05/31/2022, 9:14 AM  CC: Arthur Holms, NP

## 2022-06-04 LAB — ANTI-NUCLEAR AB-TITER (ANA TITER): ANA Titer 1: 1:80 {titer} — ABNORMAL HIGH

## 2022-06-04 LAB — SJOGREN'S SYNDROME ANTIBODS(SSA + SSB)
SSA (Ro) (ENA) Antibody, IgG: <1 AI
SSB (La) (ENA) Antibody, IgG: <1 AI

## 2022-06-04 LAB — ANTI-SCLERODERMA ANTIBODY: Scleroderma (Scl-70) (ENA) Antibody, IgG: <1 AI

## 2022-06-04 LAB — ALDOLASE: Aldolase: 3.6 U/L (ref <=8.1)

## 2022-06-04 LAB — CYCLIC CITRUL PEPTIDE ANTIBODY, IGG: Cyclic Citrullin Peptide Ab: <16 UNITS

## 2022-06-04 LAB — ANA: Anti Nuclear Antibody (ANA): POSITIVE — AB

## 2022-06-04 LAB — ANTI-DNA ANTIBODY, DOUBLE-STRANDED: ds DNA Ab: 2 IU/mL

## 2022-06-04 LAB — RHEUMATOID FACTOR: Rheumatoid fact SerPl-aCnc: <14 IU/mL (ref <14)

## 2022-06-13 ENCOUNTER — Telehealth: Payer: Self-pay | Admitting: Pulmonary Disease

## 2022-06-15 NOTE — Telephone Encounter (Signed)
Called and spoke to patient. She is requesting lab results from 05/31/2022 .  Dr. Vaughan Browner, please advise. Thanks

## 2022-06-15 NOTE — Telephone Encounter (Signed)
Still waiting for full panel of labs to come back as some are still pending.  I have sent a message to the patient via [place].  Nothing further needed at this point.

## 2022-06-21 LAB — MYOMARKER 3 PLUS PROFILE (RDL)

## 2022-06-21 LAB — HYPERSENSITIVITY PNEUMONITIS
A. Pullulans Abs: NEGATIVE
A.Fumigatus #1 Abs: NEGATIVE
Micropolyspora faeni, IgG: NEGATIVE
Pigeon Serum Abs: NEGATIVE
Thermoact. Saccharii: NEGATIVE
Thermoactinomyces vulgaris, IgG: NEGATIVE

## 2022-06-21 LAB — RNP ANTIBODIES: ENA RNP Ab: <0.2 AI (ref 0.0–0.9)

## 2022-07-02 ENCOUNTER — Ambulatory Visit
Admission: RE | Admit: 2022-07-02 | Discharge: 2022-07-02 | Disposition: A | Discharge status: Home / Self Care | Payer: Medicare Other | Source: Ambulatory Visit | Attending: Pulmonary Disease | Admitting: Pulmonary Disease

## 2022-07-02 DIAGNOSIS — J849 Interstitial pulmonary disease, unspecified: Secondary | ICD-10-CM | POA: Diagnosis present

## 2022-07-16 ENCOUNTER — Emergency Department
Admission: EM | Admit: 2022-07-16 | Discharge: 2022-07-16 | Disposition: A | Discharge status: Home / Self Care | Payer: Medicare Other | Attending: Emergency Medicine | Admitting: Emergency Medicine

## 2022-07-16 ENCOUNTER — Other Ambulatory Visit: Payer: Self-pay

## 2022-07-16 DIAGNOSIS — Z5321 Procedure and treatment not carried out due to patient leaving prior to being seen by health care provider: Secondary | ICD-10-CM | POA: Insufficient documentation

## 2022-07-16 DIAGNOSIS — M25552 Pain in left hip: Secondary | ICD-10-CM | POA: Insufficient documentation

## 2022-07-16 DIAGNOSIS — M25561 Pain in right knee: Secondary | ICD-10-CM | POA: Diagnosis not present

## 2022-07-16 DIAGNOSIS — M545 Low back pain, unspecified: Secondary | ICD-10-CM | POA: Diagnosis present

## 2022-07-16 NOTE — ED Triage Notes (Signed)
Pt to ED POV, has arthritis, recently helped daughter move heavy boxers and now is having pain in back and L hip, R knee.  Pt has been taking BC powder and meloxicam. Pt also takes ativan and gabapentin.

## 2022-07-16 NOTE — ED Provider Triage Note (Signed)
Emergency Medicine Provider Triage Evaluation Note  Morgan Riley , a 66 y.o. female  was evaluated in triage.  Pt complains of low back pain, left hip and right knee pain x 1 week after helping her daughter move. No specific injury. History of arthritis.  Physical Exam  BP (!) 156/108 (BP Location: Right Arm)   Pulse 83   Temp 98.1 F (36.7 C) (Oral)   Resp 18   Ht 5' (1.524 m)   Wt 56.7 kg   SpO2 93%   BMI 24.41 kg/m  Gen:   Awake, no distress   Resp:  Normal effort  MSK:   Moves extremities without difficulty  Other:    Medical Decision Making  Medically screening exam initiated at 1:03 PM.  Appropriate orders placed.  Morgan Riley was informed that the remainder of the evaluation will be completed by another provider, this initial triage assessment does not replace that evaluation, and the importance of remaining in the ED until their evaluation is complete.    Victorino Dike, FNP 07/16/22 1304

## 2022-08-07 ENCOUNTER — Encounter: Payer: Self-pay | Admitting: Pulmonary Disease

## 2022-08-07 NOTE — Telephone Encounter (Signed)
Dr. Vaughan Browner, please advise on pt's message regarding her labs results and her knee pain. Thanks.

## 2022-08-10 NOTE — Telephone Encounter (Signed)
The lab test that are back shows mild elevation in a factor called ANA which is likely not significant and does not explain the knee pain.  I would advise follow up with primary care or assessment with orthopedics for knee pain. Hope she feels better soon

## 2022-08-12 ENCOUNTER — Emergency Department: Payer: Medicare Other

## 2022-08-12 ENCOUNTER — Emergency Department
Admission: EM | Admit: 2022-08-12 | Discharge: 2022-08-12 | Disposition: A | Discharge status: Home / Self Care | Payer: Medicare Other | Attending: Emergency Medicine | Admitting: Emergency Medicine

## 2022-08-12 ENCOUNTER — Other Ambulatory Visit: Payer: Self-pay

## 2022-08-12 DIAGNOSIS — M25561 Pain in right knee: Secondary | ICD-10-CM | POA: Diagnosis present

## 2022-08-12 DIAGNOSIS — M1711 Unilateral primary osteoarthritis, right knee: Secondary | ICD-10-CM | POA: Insufficient documentation

## 2022-08-12 DIAGNOSIS — I10 Essential (primary) hypertension: Secondary | ICD-10-CM | POA: Diagnosis not present

## 2022-08-12 DIAGNOSIS — M5431 Sciatica, right side: Secondary | ICD-10-CM | POA: Insufficient documentation

## 2022-08-12 MED ORDER — HYDROCODONE-ACETAMINOPHEN 5-325 MG PO TABS: 1.0000 | ORAL_TABLET | Freq: Four times a day (QID) | ORAL | 0 refills | Status: AC | PRN

## 2022-08-12 MED ORDER — HYDROCODONE-ACETAMINOPHEN 5-325 MG PO TABS
1.0000 | ORAL_TABLET | Freq: Once | ORAL | Status: AC
  Administered 2022-08-12: 1 via ORAL
  Filled 2022-08-12: qty 1

## 2022-08-12 NOTE — ED Provider Notes (Signed)
[facility] Provider Note    Event Date/Time   First MD Initiated Contact with Patient 08/12/22 [phone]     (approximate)   History   Hip Pain   HPI  Morgan Riley is a 66 y.o. female  with history of osteoarthritis, hypertension, and as listed in the EMR presents to the emergency department for treatment of non-traumatic right hip and knee pain.  Patient reports that she has arthritis and she feels that it now involves her right hip.  Pain travels from buttock to thigh. No relief with diclofenac and tramadol.  She is also had a Toradol and steroid injection without relief.     Physical Exam   Triage Vital Signs: ED Triage Vitals [08/12/22 0708]  Enc Vitals Group     BP (!) 141/89     Pulse Rate 71     Resp 16     Temp 98 F (36.7 C)     Temp Source Oral     SpO2 95 %     Weight 125 lb (56.7 kg)     Height 5' (1.524 m)     Head Circumference      Peak Flow      Pain Score 10     Pain Loc      Pain Edu?      Excl. in [place]?     Most recent vital signs: Vitals:   08/12/22 0708  BP: (!) 141/89  Pulse: 71  Resp: 16  Temp: 98 F (36.7 C)  SpO2: 95%     General: Awake, no distress.  CV:  Good peripheral perfusion.  Resp:  Normal effort.  Abd:  No distention.  Other:  Able to demonstrate full range of motion of the right hip.  Able to stand and walk but with increase in pain   ED Results / Procedures / Treatments   Labs (all labs ordered are listed, but only abnormal results are displayed) Labs Reviewed - No data to display   EKG  Not indicated   RADIOLOGY     PROCEDURES:  Critical Care performed: No  Procedures   MEDICATIONS ORDERED IN ED: Medications  HYDROcodone-acetaminophen (NORCO/VICODIN) 5-325 MG per tablet 1 tablet (1 tablet Oral Given 08/12/22 0744)     IMPRESSION / MDM / [place] / ED COURSE  I reviewed the triage vital signs and the nursing notes.                              Differential  diagnosis includes, but is not limited to arthritis, sciatica, bone lesion  Patient's presentation is most consistent with acute complicated illness / injury requiring diagnostic workup.  66 year old female presenting to the emergency department for treatment and evaluation of nontraumatic right hip pain that radiates down into the buttock toward the thigh. No known trigger.  Imaging of the right hip and knee are both negative for acute concerns.  Patient states that she has significant relief with Norco while here.  Prescription for a few tablets submitted to patient's pharmacy since she has already been on anti-inflammatory, had Toradol injection, and steroid.  Opioid warnings discussed with the patient.  She will follow up with neurology/primary care if not improving over the week.  She was observed ambulating out of the emergency department with steady gait.     FINAL CLINICAL IMPRESSION(S) / ED DIAGNOSES   Final diagnoses:  Primary osteoarthritis of right  knee  Sciatica of right side     Rx / DC Orders   ED Discharge Orders          Ordered    HYDROcodone-acetaminophen (NORCO/VICODIN) 5-325 MG tablet  Every 6 hours PRN        08/12/22 0834             Note:  This document was prepared using Dragon voice recognition software and may include unintentional dictation errors.   Victorino Dike, FNP 08/12/22 6433    Naaman Plummer, MD 08/13/22 [phone]

## 2022-08-12 NOTE — ED Triage Notes (Signed)
Pt presents via POV c/o right hip pain starting last night. Denies injury. Reports hx of osteoarthritis.

## 2022-08-13 ENCOUNTER — Telehealth: Payer: Self-pay | Admitting: Cardiovascular Disease

## 2022-08-13 NOTE — Telephone Encounter (Signed)
Patient has an appointment with Neurology tomorrow.

## 2022-08-13 NOTE — Telephone Encounter (Signed)
Patient is in the ED yesterday and would like to know if Dr. Rockey Situ  can give  her neurology appt for sciatica pain. Please call daughter, Caryl Pina

## 2022-08-15 ENCOUNTER — Other Ambulatory Visit: Payer: Self-pay | Admitting: Neurology

## 2022-08-15 DIAGNOSIS — M4726 Other spondylosis with radiculopathy, lumbar region: Secondary | ICD-10-CM

## 2022-08-19 ENCOUNTER — Ambulatory Visit
Admission: RE | Admit: 2022-08-19 | Discharge: 2022-08-19 | Disposition: A | Discharge status: Home / Self Care | Payer: Medicare Other | Source: Ambulatory Visit | Attending: Neurology | Admitting: Neurology

## 2022-08-19 DIAGNOSIS — M4726 Other spondylosis with radiculopathy, lumbar region: Secondary | ICD-10-CM | POA: Insufficient documentation

## 2022-08-24 ENCOUNTER — Ambulatory Visit: Payer: Medicare Other | Admitting: Pulmonary Disease

## 2022-08-24 ENCOUNTER — Encounter: Payer: Self-pay | Admitting: Pulmonary Disease

## 2022-08-24 ENCOUNTER — Ambulatory Visit (INDEPENDENT_AMBULATORY_CARE_PROVIDER_SITE_OTHER): Payer: Medicare Other | Admitting: Pulmonary Disease

## 2022-08-24 VITALS — BP 120/72 | HR 88 | Temp 98.0°F | Ht 60.0 in | Wt 130.4 lb

## 2022-08-24 DIAGNOSIS — J849 Interstitial pulmonary disease, unspecified: Secondary | ICD-10-CM | POA: Diagnosis not present

## 2022-08-24 DIAGNOSIS — F17210 Nicotine dependence, cigarettes, uncomplicated: Secondary | ICD-10-CM | POA: Diagnosis not present

## 2022-08-24 LAB — PULMONARY FUNCTION TEST
DL/VA % pred: 83 %
DL/VA: 3.59 ml/min/mmHg/L
DLCO cor % pred: 72 %
DLCO cor: 12.3 ml/min/mmHg
DLCO unc % pred: 72 %
DLCO unc: 12.3 ml/min/mmHg
FEF 25-75 Post: 1.56 L/sec
FEF 25-75 Pre: 1.17 L/sec
FEF2575-%Change-Post: 33 %
FEF2575-%Pred-Post: 88 %
FEF2575-%Pred-Pre: 65 %
FEV1-%Change-Post: 8 %
FEV1-%Pred-Post: 91 %
FEV1-%Pred-Pre: 83 %
FEV1-Post: 1.79 L
FEV1-Pre: 1.64 L
FEV1FVC-%Change-Post: -3 %
FEV1FVC-%Pred-Pre: 92 %
FEV6-%Change-Post: 11 %
FEV6-%Pred-Post: 104 %
FEV6-%Pred-Pre: 94 %
FEV6-Post: 2.59 L
FEV6-Pre: 2.34 L
FEV6FVC-%Change-Post: -1 %
FEV6FVC-%Pred-Post: 103 %
FEV6FVC-%Pred-Pre: 104 %
FVC-%Change-Post: 12 %
FVC-%Pred-Post: 101 %
FVC-%Pred-Pre: 90 %
FVC-Post: 2.62 L
FVC-Pre: 2.34 L
Post FEV1/FVC ratio: 68 %
Post FEV6/FVC ratio: 99 %
Pre FEV1/FVC ratio: 70 %
Pre FEV6/FVC Ratio: 100 %
RV % pred: 137 %
RV: 2.69 L
TLC % pred: 109 %
TLC: 4.9 L

## 2022-08-24 MED ORDER — VARENICLINE TARTRATE (STARTER) 0.5 MG X 11 & 1 MG X 42 PO TBPK: ORAL_TABLET | ORAL | 0 refills | Status: AC

## 2022-08-24 NOTE — Progress Notes (Signed)
1:00 is a no-show              Morgan Riley    [id number]    1956-04-15  Primary Care Physician:Nguyen, Maudie Mercury, NP  Referring Physician: Arthur Holms, NP [address] [place],  [place] [address]  Chief complaint: Follow-up for interstitial lung disease  HPI: 66 y.o. who  has a past medical history of Allergy, Arthritis, Carpal tunnel syndrome, Depression, GERD (gastroesophageal reflux disease), Hyperlipidemia, Hypertension, and Mitral valve prolapse.   Here for evaluation of abnormal screening CT that showed mild interstitial lung disease.  Has mild dyspnea on exertion.  No cough or congestion.  She is just on albuterol inhaler. Has no significant exposures noted.  Reports arthritis in the right knee and small joints of the hand with morning stiffness.  Also has dry mouth and dry eyes.  Denies any rash, difficulty swallowing Reports 2 episodes of COVID-19 virus and pneumonia earlier this year.  She did not require hospitalization for this.  Pets: Dogs, cats.  Had a parrot for a few years until 2010 Occupation: Retired.  Used to be a homemaker Exposures: No mold, hot tub, Jacuzzi.  No feather pillows or comforter ILD questionnaire 05/31/2022-negative Smoking history: 35-pack-year smoker.  Continues to smoke 1 and half packs per day Travel history: Originally from [place].  No significant recent travel Relevant family history: Sister had emphysema.  She was a smoker.  Interim history: Here for review of CT, PFTs States that breathing is stable She is not on any controller medication Continues to smoke   Outpatient Encounter Medications as of 08/24/2022  Medication Sig   alendronate (FOSAMAX) 70 MG tablet Take 1 tablet (70 mg total) by mouth every 7 (seven) days. Take with a full glass of water on an empty stomach. Do not lay flat for 1 hour.   atorvastatin (LIPITOR) 40 MG tablet Take 40 mg by mouth at bedtime.   baclofen (LIORESAL) 10 MG tablet Take by mouth.   gabapentin (NEURONTIN) 300  MG capsule Take 1 capsule (300 mg total) by mouth 2 (two) times daily.   hydrOXYzine (ATARAX/VISTARIL) 10 MG tablet TAKE 1 TABLET (10 MG TOTAL) BY MOUTH 2 (TWO) TIMES DAILY AS NEEDED FOR ANXIETY.   lisinopril (ZESTRIL) 20 MG tablet TAKE 1 TABLET (20 MG TOTAL) BY MOUTH DAILY. FOR BLOOD PRESSURE.   LORazepam (ATIVAN) 0.5 MG tablet Take 0.5 mg by mouth daily as needed.   meloxicam (MOBIC) 7.5 MG tablet Take by mouth.   omeprazole (PRILOSEC) 20 MG capsule TAKE 1 CAPSULE (20 MG TOTAL) BY MOUTH DAILY. FOR HEARTBURN   sertraline (ZOLOFT) 100 MG tablet TAKE 1 TABLET BY MOUTH EVERY DAY   simvastatin (ZOCOR) 40 MG tablet TAKE 1 TABLET (40 MG TOTAL) BY MOUTH NIGHTLY for cholesterol.   [DISCONTINUED] doxycycline (VIBRAMYCIN) 100 MG capsule Take 100 mg by mouth 2 (two) times daily.   No facility-administered encounter medications on file as of 08/24/2022.    Physical Exam: Blood pressure 120/72, pulse 88, temperature 98 F (36.7 C), temperature source Oral, height 5' (1.524 m), weight 130 lb 6.4 oz (59.1 kg), SpO2 96 %. Gen:      No acute distress HEENT:  EOMI, sclera anicteric Neck:     No masses; no thyromegaly Lungs:    Clear to auscultation bilaterally; normal respiratory effort CV:         Regular rate and rhythm; no murmurs Abd:      + bowel sounds; soft, non-tender; no palpable masses, no distension Ext:  No edema; adequate peripheral perfusion Skin:      Warm and dry; no rash Neuro: alert and oriented x 3 Psych: normal mood and affect   Data Reviewed: Imaging: Screening CT chest 12/07/20-centrilobular and paraseptal emphysema.  Minimal groundglass reticulation Screening CT 04/25/2022-mild subpleural reticulation bilaterally, lower lobe predominant.  Mildly progressed compared to 2022 High-resolution CT 07/02/2022-stable pattern of pulmonary fibrosis compared to 2022.  Indeterminate for UIP. I have reviewed the images personally.  PFTs: 08/24/2022 FVC 2.62 [101%], FEV1 1.79 [91%], F/F 68,  TLC 4.90 [99%], DLCO 12.30 [72%] Bronchodilator response and curvature to flow loop indicates small airway disease Minimal diffusion defect  Labs: CTD serologies 05/31/22-ANA 1 is to 80, nuclear homogeneous  Assessment:  Interstitial lung disease Her CT shows reticulation bilaterally with basilar predominance. She had COVID-19 earlier this year which appeared to be mild case though her changes was seen back in 2022. She does have joint symptoms and ANA is mildly positive.  PFTs reviewed with minimal loss of diffusion capacity  Differential diagnosis at this point is CTD related ILD versus post-COVID ILD or smoking-related changes Will discuss smoking cessation.  She has an evaluation by rheumatology pending in [place] her findings have remained stable since 2022.  We will monitor closely and get a follow-up CT in 6 months  Active smoker Discussed smoking cessation in detail with patient.  She is interested in quitting Prescribe Chantix.  She will use nicotine patches over-the-counter. Time spent counseling-5 minutes.  Reassess at return visit.  Plan/Recommendations: Rheumatology evaluation Smoking cessation Follow-up CT in 6 months  Marshell Garfinkel MD [facility] 08/24/2022, 1:14 PM  CC: Arthur Holms, NP

## 2022-08-24 NOTE — Patient Instructions (Signed)
His CT shows some mild changes of interstitial lung disease.  Please follow-up with dermatology for evaluation of elevated ANA Please work on smoking cessation Use nicotine patches over-the-counter.  We will call in a prescription for Chantix Will order a follow-up high-resolution CT in 6 months and return to clinic after CT

## 2022-08-24 NOTE — Progress Notes (Signed)
PFT done today. 

## 2022-10-09 ENCOUNTER — Encounter: Payer: Medicare Other | Admitting: Nurse Practitioner

## 2022-10-12 ENCOUNTER — Telehealth: Payer: Medicare Other | Admitting: Physician Assistant

## 2022-10-12 DIAGNOSIS — S32010A Wedge compression fracture of first lumbar vertebra, initial encounter for closed fracture: Secondary | ICD-10-CM

## 2022-10-12 DIAGNOSIS — M545 Low back pain, unspecified: Secondary | ICD-10-CM

## 2022-10-12 NOTE — Progress Notes (Signed)
Based on what you shared with me, I feel your condition warrants further evaluation as soon as possible at an Emergency department. With a new compression fracture and uncontrolled pain, I would recommend for you to be seen for pain control and further evaluation.    NOTE: There will be NO CHARGE for this eVisit   If you are having a true medical emergency please call 911.      Emergency [facility]  Get Driving Directions  [phone]  [address]  [place], [place] [address]  Open 24/7/365      [facility] Emergency Department at [place]  [address]  [place], [place] [address]  Open 24/7/365    Emergency [facility]  Get Driving Directions  [phone]  [address], [place] [address]  Open 24/7/365      [facility]  Get Driving Directions  [phone]  [address]  [place], [place] [address]  Open 24/7/365    [facility]  Emergency [place]  Get Driving Directions  [phone]  [place], [place] [address]  Open 24/7/365    [facility]  Emergency Department- [place]  Get Driving Directions  [address]  [place], [place] [address]  Open 24/7/365    [facility]  Emergency [facility]  Get Driving Directions  [phone]  [address]  [place], [place] [address]  Open 24/7/365   I have spent 5 minutes in review of e-visit questionnaire, review and updating patient chart, medical decision making and response to patient.   Mar Daring, PA-C

## 2022-10-19 ENCOUNTER — Other Ambulatory Visit: Payer: Self-pay | Admitting: Orthopedic Surgery

## 2022-10-19 DIAGNOSIS — S32010A Wedge compression fracture of first lumbar vertebra, initial encounter for closed fracture: Secondary | ICD-10-CM

## 2022-10-28 ENCOUNTER — Ambulatory Visit
Admission: RE | Admit: 2022-10-28 | Discharge: 2022-10-28 | Disposition: A | Discharge status: Home / Self Care | Payer: Medicare Other | Source: Ambulatory Visit | Attending: Orthopedic Surgery | Admitting: Orthopedic Surgery

## 2022-10-28 DIAGNOSIS — S32010A Wedge compression fracture of first lumbar vertebra, initial encounter for closed fracture: Secondary | ICD-10-CM | POA: Diagnosis present

## 2023-02-19 ENCOUNTER — Ambulatory Visit: Payer: Medicare Other | Admitting: Dermatology

## 2023-02-19 VITALS — BP 131/87 | HR 74

## 2023-02-19 DIAGNOSIS — L578 Other skin changes due to chronic exposure to nonionizing radiation: Secondary | ICD-10-CM

## 2023-02-19 DIAGNOSIS — W908XXA Exposure to other nonionizing radiation, initial encounter: Secondary | ICD-10-CM

## 2023-02-19 DIAGNOSIS — C4401 Basal cell carcinoma of skin of lip: Secondary | ICD-10-CM | POA: Diagnosis not present

## 2023-02-19 DIAGNOSIS — X32XXXA Exposure to sunlight, initial encounter: Secondary | ICD-10-CM | POA: Diagnosis not present

## 2023-02-19 DIAGNOSIS — D485 Neoplasm of uncertain behavior of skin: Secondary | ICD-10-CM

## 2023-02-19 DIAGNOSIS — C4491 Basal cell carcinoma of skin, unspecified: Secondary | ICD-10-CM

## 2023-02-19 HISTORY — DX: Basal cell carcinoma of skin, unspecified: C44.91

## 2023-02-19 NOTE — Progress Notes (Signed)
   Follow-Up Visit   Subjective  Morgan Riley is a 67 y.o. female who presents for the following: Spot of the upper lip/inf nasal area, present x 1 year. Not painful or itchy, but scratches. No history of skin cancer.   The patient has spots, moles and lesions to be evaluated, some may be new or changing and the patient has concerns that these could be cancer.  Patient accompanied by husband.  The following portions of the chart were reviewed this encounter and updated as appropriate: medications, allergies, medical history  Review of Systems:  No other skin or systemic complaints except as noted in HPI or Assessment and Plan.  Objective  Well appearing patient in no apparent distress; mood and affect are within normal limits.  A focused examination was performed of the following areas: face Relevant physical exam findings are noted in the Assessment and Plan.  Left Upper Lip Inferior to Alar Crease 0.6 cm pink pearly papule with telangiectasia       Assessment & Plan   Neoplasm of uncertain behavior of skin Left Upper Lip Inferior to Alar Crease  Skin / nail biopsy Type of biopsy: tangential   Informed consent: discussed and consent obtained   Patient was prepped and draped in usual sterile fashion: Area prepped with alcohol. Anesthesia: the lesion was anesthetized in a standard fashion   Anesthetic:  1% lidocaine w/ epinephrine 1-100,000 buffered w/ 8.4% NaHCO3 Instrument used: flexible razor blade   Hemostasis achieved with: pressure, aluminum chloride and electrodesiccation   Outcome: patient tolerated procedure well   Post-procedure details: wound care instructions given   Post-procedure details comment:  Ointment and small bandage applied  Specimen 1 - Surgical pathology Differential Diagnosis: r/o BCC Check Margins: No  If positive for [facility], will send for Mohs in [place].    ACTINIC DAMAGE - chronic, secondary to cumulative UV radiation exposure/sun  exposure over time - diffuse scaly erythematous macules with underlying dyspigmentation - Recommend daily broad spectrum sunscreen SPF 30+ to sun-exposed areas, reapply every 2 hours as needed.  - Recommend staying in the shade or wearing long sleeves, sun glasses (UVA+UVB protection) and wide brim hats (4-inch brim around the entire circumference of the hat). - Call for new or changing lesions.    Return pending biopsy results.  ICherlyn Labella, CMA, am acting as scribe for Willeen Niece, MD .   Documentation: I have reviewed the above documentation for accuracy and completeness, and I agree with the above.  Willeen Niece, MD

## 2023-02-19 NOTE — Patient Instructions (Addendum)
Wound Care Instructions  Cleanse wound gently with soap and water once a day then pat dry with clean gauze. Apply a thin coat of Petrolatum (petroleum jelly, "Vaseline") over the wound (unless you have an allergy to this). We recommend that you use a new, sterile tube of Vaseline. Do not pick or remove scabs. Do not remove the yellow or white "healing tissue" from the base of the wound.  Cover the wound with fresh, clean, nonstick gauze and secure with paper tape. You may use Band-Aids in place of gauze and tape if the wound is small enough, but would recommend trimming much of the tape off as there is often too much. Sometimes Band-Aids can irritate the skin.  You should call the office for your biopsy report after 1 week if you have not already been contacted.  If you experience any problems, such as abnormal amounts of bleeding, swelling, significant bruising, significant pain, or evidence of infection, please call the office immediately.  FOR ADULT SURGERY PATIENTS: If you need something for pain relief you may take 1 extra strength Tylenol (acetaminophen) AND 2 Ibuprofen (200mg each) together every 4 hours as needed for pain. (do not take these if you are allergic to them or if you have a reason you should not take them.) Typically, you may only need pain medication for 1 to 3 days.     Due to recent changes in healthcare laws, you may see results of your pathology and/or laboratory studies on MyChart before the doctors have had a chance to review them. We understand that in some cases there may be results that are confusing or concerning to you. Please understand that not all results are received at the same time and often the doctors may need to interpret multiple results in order to provide you with the best plan of care or course of treatment. Therefore, we ask that you please give us 2 business days to thoroughly review all your results before contacting the office for clarification. Should  we see a critical lab result, you will be contacted sooner.   If You Need Anything After Your Visit  If you have any questions or concerns for your doctor, please call our main line at [phone] and press option 4 to reach your doctor's medical assistant. If no one answers, please leave a voicemail as directed and we will return your call as soon as possible. Messages left after 4 pm will be answered the following business day.   You may also send us a message via MyChart. We typically respond to MyChart messages within 1-2 business days.  For prescription refills, please ask your pharmacy to contact our office. Our fax number is [phone].  If you have an urgent issue when the clinic is closed that cannot wait until the next business day, you can page your doctor at the number below.    Please note that while we do our best to be available for urgent issues outside of office hours, we are not available 24/7.   If you have an urgent issue and are unable to reach us, you may choose to seek medical care at your doctor's office, retail clinic, urgent care center, or emergency room.  If you have a medical emergency, please immediately call 911 or go to the emergency department.  Pager Numbers  - Dr. Kowalski: [phone]  - Dr. Moye: [phone]  - Dr. Stewart: [phone]  In the event of inclement weather, please call our main line at   [phone] for an update on the status of any delays or closures.  Dermatology Medication Tips: Please keep the boxes that topical medications come in in order to help keep track of the instructions about where and how to use these. Pharmacies typically print the medication instructions only on the boxes and not directly on the medication tubes.   If your medication is too expensive, please contact our office at [phone] option 4 or send us a message through MyChart.   We are unable to tell what your co-pay for medications will be in  advance as this is different depending on your insurance coverage. However, we may be able to find a substitute medication at lower cost or fill out paperwork to get insurance to cover a needed medication.   If a prior authorization is required to get your medication covered by your insurance company, please allow us 1-2 business days to complete this process.  Drug prices often vary depending on where the prescription is filled and some pharmacies may offer cheaper prices.  The website www.goodrx.com contains coupons for medications through different pharmacies. The prices here do not account for what the cost may be with help from insurance (it may be cheaper with your insurance), but the website can give you the price if you did not use any insurance.  - You can print the associated coupon and take it with your prescription to the pharmacy.  - You may also stop by our office during regular business hours and pick up a GoodRx coupon card.  - If you need your prescription sent electronically to a different pharmacy, notify our office through [facility] MyChart or by phone at [phone] option 4.     Si Usted Necesita Algo Despus de Su Visita  Tambin puede enviarnos un mensaje a travs de MyChart. Por lo general respondemos a los mensajes de MyChart en el transcurso de 1 a 2 das hbiles.  Para renovar recetas, por favor pida a su farmacia que se ponga en contacto con nuestra oficina. Nuestro nmero de fax es el [phone].  Si tiene un asunto urgente cuando la clnica est cerrada y que no puede esperar hasta el siguiente da hbil, puede llamar/localizar a su doctor(a) al nmero que aparece a continuacin.   Por favor, tenga en cuenta que aunque hacemos todo lo posible para estar disponibles para asuntos urgentes fuera del horario de oficina, no estamos disponibles las 24 horas del da, los 7 das de la semana.   Si tiene un problema urgente y no puede comunicarse con nosotros, puede  optar por buscar atencin mdica  en el consultorio de su doctor(a), en una clnica privada, en un centro de atencin urgente o en una sala de emergencias.  Si tiene una emergencia mdica, por favor llame inmediatamente al 911 o vaya a la sala de emergencias.  Nmeros de bper  - Dr. Kowalski: [phone]  - Dra. Moye: [phone]  - Dra. Stewart: [phone]  En caso de inclemencias del tiempo, por favor llame a nuestra lnea principal al [phone] para una actualizacin sobre el estado de cualquier retraso o cierre.  Consejos para la medicacin en dermatologa: Por favor, guarde las cajas en las que vienen los medicamentos de uso tpico para ayudarle a seguir las instrucciones sobre dnde y cmo usarlos. Las farmacias generalmente imprimen las instrucciones del medicamento slo en las cajas y no directamente en los tubos del medicamento.   Si su medicamento es muy caro, por favor, pngase en contacto con   nuestra oficina llamando al [phone] y presione la opcin 4 o envenos un mensaje a travs de MyChart.   No podemos decirle cul ser su copago por los medicamentos por adelantado ya que esto es diferente dependiendo de la cobertura de su seguro. Sin embargo, es posible que podamos encontrar un medicamento sustituto a menor costo o llenar un formulario para que el seguro cubra el medicamento que se considera necesario.   Si se requiere una autorizacin previa para que su compaa de seguros cubra su medicamento, por favor permtanos de 1 a 2 das hbiles para completar este proceso.  Los precios de los medicamentos varan con frecuencia dependiendo del lugar de dnde se surte la receta y alguna farmacias pueden ofrecer precios ms baratos.  El sitio web www.goodrx.com tiene cupones para medicamentos de diferentes farmacias. Los precios aqu no tienen en cuenta lo que podra costar con la ayuda del seguro (puede ser ms barato con su seguro), pero el sitio web puede darle el  precio si no utiliz ningn seguro.  - Puede imprimir el cupn correspondiente y llevarlo con su receta a la farmacia.  - Tambin puede pasar por nuestra oficina durante el horario de atencin regular y recoger una tarjeta de cupones de GoodRx.  - Si necesita que su receta se enve electrnicamente a una farmacia diferente, informe a nuestra oficina a travs de MyChart de [facility] o por telfono llamando al [phone] y presione la opcin 4.  

## 2023-02-22 ENCOUNTER — Ambulatory Visit
Admission: RE | Admit: 2023-02-22 | Discharge: 2023-02-22 | Disposition: A | Discharge status: Home / Self Care | Payer: Medicare Other | Source: Ambulatory Visit | Attending: Pulmonary Disease | Admitting: Pulmonary Disease

## 2023-02-22 DIAGNOSIS — J849 Interstitial pulmonary disease, unspecified: Secondary | ICD-10-CM

## 2023-02-25 ENCOUNTER — Telehealth: Payer: Self-pay

## 2023-02-25 DIAGNOSIS — C4401 Basal cell carcinoma of skin of lip: Secondary | ICD-10-CM

## 2023-02-25 NOTE — Telephone Encounter (Signed)
-----   Message from Willeen Niece, MD sent at 02/25/2023 12:54 PM EDT ----- Skin , left upper lip inferior to alar crease BASAL CELL CARCINOMA, INFILTRATIVE PATTERN  BCC skin cancer- recommend Mohs surgery, refer to [facility] - please call patient

## 2023-02-25 NOTE — Telephone Encounter (Signed)
Advised pt of bx results.  Advised we would place referral to [facility] and they would call and schedule her appointment./sh

## 2023-03-12 ENCOUNTER — Emergency Department
Admission: EM | Admit: 2023-03-12 | Discharge: 2023-03-12 | Disposition: A | Discharge status: Home / Self Care | Payer: Medicare Other | Attending: Emergency Medicine | Admitting: Emergency Medicine

## 2023-03-12 ENCOUNTER — Other Ambulatory Visit: Payer: Self-pay

## 2023-03-12 DIAGNOSIS — R42 Dizziness and giddiness: Secondary | ICD-10-CM | POA: Diagnosis present

## 2023-03-12 DIAGNOSIS — I10 Essential (primary) hypertension: Secondary | ICD-10-CM | POA: Diagnosis not present

## 2023-03-12 LAB — CBC
HCT: 41.9 % (ref 36.0–46.0)
Hemoglobin: 13.6 g/dL (ref 12.0–15.0)
MCH: 30.2 pg (ref 26.0–34.0)
MCHC: 32.5 g/dL (ref 30.0–36.0)
MCV: 92.9 fL (ref 80.0–100.0)
Platelets: 292 10*3/uL (ref 150–400)
RBC: 4.51 MIL/uL (ref 3.87–5.11)
RDW: 13.4 % (ref 11.5–15.5)
WBC: 8.5 10*3/uL (ref 4.0–10.5)
nRBC: 0 % (ref 0.0–0.2)

## 2023-03-12 LAB — BASIC METABOLIC PANEL
Anion gap: 5 (ref 5–15)
BUN: 23 mg/dL (ref 8–23)
CO2: 25 mmol/L (ref 22–32)
Calcium: 9.3 mg/dL (ref 8.9–10.3)
Chloride: 105 mmol/L (ref 98–111)
Creatinine, Ser: 0.66 mg/dL (ref 0.44–1.00)
GFR, Estimated: >60 mL/min (ref >60)
Glucose, Bld: 108 mg/dL — ABNORMAL HIGH (ref 70–99)
Potassium: 4 mmol/L (ref 3.5–5.1)
Sodium: 135 mmol/L (ref 135–145)

## 2023-03-12 MED ORDER — AMLODIPINE BESYLATE 5 MG PO TABS: 5.0000 mg | ORAL_TABLET | Freq: Every day | ORAL | 11 refills | Status: AC

## 2023-03-12 NOTE — ED Provider Notes (Signed)
[facility] Provider Note    Event Date/Time   First MD Initiated Contact with Patient 03/12/23 [phone]     (approximate)   History   Dizziness   HPI  Tenley Limbacher is a 67 y.o. female with a history of high blood pressure who presents with elevated blood pressure.  She was unable to have Mohs surgery performed today because blood pressure was too high.  She reports her PCP recently increased her lisinopril from 20 mg to 40 mg a day.  Overall she feels well and has no complaints at this time.  She does report yesterday when standing up quickly she had a brief episode of lightheadedness which resolved.  No chest pain no shortness of breath.     Physical Exam   Triage Vital Signs: ED Triage Vitals [03/12/23 0915]  Enc Vitals Group     BP (!) 188/98     Pulse Rate 66     Resp 18     Temp 98 F (36.7 C)     Temp src      SpO2 100 %     Weight      Height      Head Circumference      Peak Flow      Pain Score      Pain Loc      Pain Edu?      Excl. in GC?     Most recent vital signs: Vitals:   03/12/23 0930 03/12/23 0945  BP: (!) 166/89   Pulse: 63 62  Resp: 19 19  Temp:    SpO2: 99% 96%     General: Awake, no distress.  CV:  Good peripheral perfusion.  Regular rate and rhythm Resp:  Normal effort.  Clear to auscultation bilaterally Abd:  No distention.  Other:     ED Results / Procedures / Treatments   Labs (all labs ordered are listed, but only abnormal results are displayed) Labs Reviewed  BASIC METABOLIC PANEL - Abnormal; Notable for the following components:      Result Value   Glucose, Bld 108 (*)    All other components within normal limits  CBC     EKG  ED ECG REPORT I, Jene Every, the attending physician, personally viewed and interpreted this ECG.  Date: 03/12/2023  Rhythm: normal sinus rhythm QRS Axis: normal Intervals: normal ST/T Wave abnormalities: normal Narrative Interpretation: no evidence of acute  ischemia    RADIOLOGY    PROCEDURES:  Critical Care performed:   Procedures   MEDICATIONS ORDERED IN ED: Medications - No data to display   IMPRESSION / MDM / ASSESSMENT AND PLAN / ED COURSE  I reviewed the triage vital signs and the nursing notes. Patient's presentation is most consistent with exacerbation of chronic illness.  Patient presents with elevated blood pressure as detailed above.  Overall she is well-appearing and in no acute distress.  Will check kidney function, CBC  Anticipate starting amlodipine 5 mg with close follow-up with PCP  Lab work is reassuring, will start the patient on amlodipine 5 mg, refills provided, close follow-up with PCP within 1 week ideally      FINAL CLINICAL IMPRESSION(S) / ED DIAGNOSES   Final diagnoses:  Uncontrolled hypertension     Rx / DC Orders   ED Discharge Orders          Ordered    amLODipine (NORVASC) 5 MG tablet  Daily  03/12/23 1610             Note:  This document was prepared using Dragon voice recognition software and may include unintentional dictation errors.   Jene Every, MD 03/12/23 1128

## 2023-03-12 NOTE — ED Notes (Signed)
See triage note, pt complains of intermittent dizziness and bilateral blurry vision, no dizziness, blurry vision, SOB or CP at this moment. Pt was recently increased from 20mg  linisopril to 40mg  about 2 weeks ago. Pt sent to ED from outpatient office because BP was high, was supposed to have BCC removed from face. BP was 182/130. Has been taking all meds. BP currently 188/98.

## 2023-03-12 NOTE — ED Triage Notes (Signed)
Pt comes with c/o dizziness and elevated BP. Pt states pain left temple and eye. Pt states this has been going on more recently.  Pt states she is on BP meds did have it increased. Pt went for procedure today and they couldn't do it since her Bp was elevated.

## 2023-03-27 ENCOUNTER — Other Ambulatory Visit: Payer: Self-pay | Admitting: *Deleted

## 2023-03-27 DIAGNOSIS — J849 Interstitial pulmonary disease, unspecified: Secondary | ICD-10-CM

## 2023-04-29 ENCOUNTER — Ambulatory Visit: Payer: Medicare Other | Attending: *Deleted

## 2024-03-16 ENCOUNTER — Other Ambulatory Visit
